# Patient Record
Sex: Female | Born: 1980 | Race: Black or African American | Hispanic: No | Marital: Single | Smoking: Never smoker
Health system: Southern US, Community
[De-identification: ages and names within clinical notes are randomized; demographics above are authoritative.]

---

## 2002-04-10 ENCOUNTER — Encounter: Payer: Self-pay | Admitting: Emergency Medicine

## 2002-04-10 ENCOUNTER — Observation Stay (HOSPITAL_COMMUNITY): Admission: EM | Admit: 2002-04-10 | Discharge: 2002-04-11 | Payer: Self-pay | Admitting: Emergency Medicine

## 2002-06-28 ENCOUNTER — Emergency Department (HOSPITAL_COMMUNITY): Admission: EM | Admit: 2002-06-28 | Discharge: 2002-06-28 | Payer: Self-pay | Admitting: Emergency Medicine

## 2002-07-07 ENCOUNTER — Emergency Department (HOSPITAL_COMMUNITY): Admission: EM | Admit: 2002-07-07 | Discharge: 2002-07-07 | Payer: Self-pay | Admitting: Emergency Medicine

## 2002-07-15 ENCOUNTER — Encounter: Payer: Self-pay | Admitting: Emergency Medicine

## 2002-07-15 ENCOUNTER — Emergency Department (HOSPITAL_COMMUNITY): Admission: EM | Admit: 2002-07-15 | Discharge: 2002-07-15 | Payer: Self-pay | Admitting: Emergency Medicine

## 2002-07-19 ENCOUNTER — Emergency Department (HOSPITAL_COMMUNITY): Admission: EM | Admit: 2002-07-19 | Discharge: 2002-07-19 | Payer: Self-pay | Admitting: Emergency Medicine

## 2002-07-21 ENCOUNTER — Emergency Department (HOSPITAL_COMMUNITY): Admission: EM | Admit: 2002-07-21 | Discharge: 2002-07-22 | Payer: Self-pay | Admitting: Emergency Medicine

## 2002-08-07 ENCOUNTER — Emergency Department (HOSPITAL_COMMUNITY): Admission: EM | Admit: 2002-08-07 | Discharge: 2002-08-08 | Payer: Self-pay | Admitting: Emergency Medicine

## 2002-08-09 ENCOUNTER — Emergency Department (HOSPITAL_COMMUNITY): Admission: EM | Admit: 2002-08-09 | Discharge: 2002-08-10 | Payer: Self-pay | Admitting: Emergency Medicine

## 2002-08-12 ENCOUNTER — Emergency Department (HOSPITAL_COMMUNITY): Admission: EM | Admit: 2002-08-12 | Discharge: 2002-08-12 | Payer: Self-pay | Admitting: Emergency Medicine

## 2002-08-16 ENCOUNTER — Emergency Department (HOSPITAL_COMMUNITY): Admission: EM | Admit: 2002-08-16 | Discharge: 2002-08-16 | Payer: Self-pay | Admitting: Emergency Medicine

## 2002-08-17 ENCOUNTER — Encounter: Payer: Self-pay | Admitting: Emergency Medicine

## 2002-08-18 ENCOUNTER — Inpatient Hospital Stay (HOSPITAL_COMMUNITY): Admission: EM | Admit: 2002-08-18 | Discharge: 2002-08-27 | Payer: Self-pay

## 2002-08-21 ENCOUNTER — Encounter: Payer: Self-pay | Admitting: Internal Medicine

## 2002-08-23 ENCOUNTER — Encounter: Payer: Self-pay | Admitting: Internal Medicine

## 2002-09-19 ENCOUNTER — Other Ambulatory Visit: Admission: RE | Admit: 2002-09-19 | Discharge: 2002-09-19 | Payer: Self-pay | Admitting: Obstetrics and Gynecology

## 2002-11-01 ENCOUNTER — Emergency Department (HOSPITAL_COMMUNITY): Admission: EM | Admit: 2002-11-01 | Discharge: 2002-11-02 | Payer: Self-pay | Admitting: Emergency Medicine

## 2002-11-20 ENCOUNTER — Inpatient Hospital Stay (HOSPITAL_COMMUNITY): Admission: AD | Admit: 2002-11-20 | Discharge: 2002-11-20 | Payer: Self-pay | Admitting: Obstetrics and Gynecology

## 2002-11-20 ENCOUNTER — Encounter: Payer: Self-pay | Admitting: Obstetrics and Gynecology

## 2002-11-22 ENCOUNTER — Inpatient Hospital Stay (HOSPITAL_COMMUNITY): Admission: AD | Admit: 2002-11-22 | Discharge: 2002-11-22 | Payer: Self-pay | Admitting: Obstetrics and Gynecology

## 2002-12-09 ENCOUNTER — Inpatient Hospital Stay (HOSPITAL_COMMUNITY): Admission: AD | Admit: 2002-12-09 | Discharge: 2002-12-09 | Payer: Self-pay | Admitting: Obstetrics and Gynecology

## 2002-12-21 ENCOUNTER — Inpatient Hospital Stay (HOSPITAL_COMMUNITY): Admission: AD | Admit: 2002-12-21 | Discharge: 2002-12-21 | Payer: Self-pay | Admitting: Obstetrics and Gynecology

## 2002-12-28 ENCOUNTER — Encounter: Admission: RE | Admit: 2002-12-28 | Discharge: 2002-12-28 | Payer: Self-pay | Admitting: Dentistry

## 2003-01-26 ENCOUNTER — Inpatient Hospital Stay (HOSPITAL_COMMUNITY): Admission: AD | Admit: 2003-01-26 | Discharge: 2003-01-26 | Payer: Self-pay | Admitting: Obstetrics and Gynecology

## 2003-02-09 ENCOUNTER — Inpatient Hospital Stay (HOSPITAL_COMMUNITY): Admission: AD | Admit: 2003-02-09 | Discharge: 2003-02-12 | Payer: Self-pay | Admitting: Obstetrics and Gynecology

## 2003-02-10 ENCOUNTER — Encounter (INDEPENDENT_AMBULATORY_CARE_PROVIDER_SITE_OTHER): Payer: Self-pay | Admitting: *Deleted

## 2003-03-06 ENCOUNTER — Ambulatory Visit: Admission: RE | Admit: 2003-03-06 | Discharge: 2003-03-06 | Payer: Self-pay | Admitting: Obstetrics and Gynecology

## 2003-03-07 ENCOUNTER — Emergency Department (HOSPITAL_COMMUNITY): Admission: AD | Admit: 2003-03-07 | Discharge: 2003-03-07 | Payer: Self-pay | Admitting: Family Medicine

## 2003-03-18 ENCOUNTER — Emergency Department (HOSPITAL_COMMUNITY): Admission: EM | Admit: 2003-03-18 | Discharge: 2003-03-18 | Payer: Self-pay | Admitting: Emergency Medicine

## 2003-03-20 ENCOUNTER — Other Ambulatory Visit: Admission: RE | Admit: 2003-03-20 | Discharge: 2003-03-20 | Payer: Self-pay | Admitting: Obstetrics and Gynecology

## 2003-07-03 ENCOUNTER — Emergency Department (HOSPITAL_COMMUNITY): Admission: EM | Admit: 2003-07-03 | Discharge: 2003-07-03 | Payer: Self-pay | Admitting: Emergency Medicine

## 2003-07-05 ENCOUNTER — Emergency Department (HOSPITAL_COMMUNITY): Admission: EM | Admit: 2003-07-05 | Discharge: 2003-07-05 | Payer: Self-pay | Admitting: Emergency Medicine

## 2003-07-08 ENCOUNTER — Ambulatory Visit (HOSPITAL_COMMUNITY): Admission: AD | Admit: 2003-07-08 | Discharge: 2003-07-09 | Payer: Self-pay | Admitting: Obstetrics and Gynecology

## 2003-07-09 ENCOUNTER — Encounter (INDEPENDENT_AMBULATORY_CARE_PROVIDER_SITE_OTHER): Payer: Self-pay | Admitting: *Deleted

## 2003-07-29 ENCOUNTER — Emergency Department (HOSPITAL_COMMUNITY): Admission: EM | Admit: 2003-07-29 | Discharge: 2003-07-29 | Payer: Self-pay | Admitting: Emergency Medicine

## 2003-08-20 ENCOUNTER — Emergency Department (HOSPITAL_COMMUNITY): Admission: EM | Admit: 2003-08-20 | Discharge: 2003-08-20 | Payer: Self-pay | Admitting: Emergency Medicine

## 2003-08-21 ENCOUNTER — Emergency Department (HOSPITAL_COMMUNITY): Admission: EM | Admit: 2003-08-21 | Discharge: 2003-08-21 | Payer: Self-pay | Admitting: Emergency Medicine

## 2003-10-22 ENCOUNTER — Inpatient Hospital Stay (HOSPITAL_COMMUNITY): Admission: AD | Admit: 2003-10-22 | Discharge: 2003-10-22 | Payer: Self-pay | Admitting: Family Medicine

## 2003-11-02 ENCOUNTER — Ambulatory Visit (HOSPITAL_COMMUNITY): Admission: RE | Admit: 2003-11-02 | Discharge: 2003-11-02 | Payer: Self-pay | Admitting: Family Medicine

## 2003-11-03 ENCOUNTER — Emergency Department (HOSPITAL_COMMUNITY): Admission: EM | Admit: 2003-11-03 | Discharge: 2003-11-03 | Payer: Self-pay

## 2003-11-04 ENCOUNTER — Emergency Department (HOSPITAL_COMMUNITY): Admission: EM | Admit: 2003-11-04 | Discharge: 2003-11-04 | Payer: Self-pay | Admitting: Emergency Medicine

## 2003-11-07 ENCOUNTER — Emergency Department (HOSPITAL_COMMUNITY): Admission: EM | Admit: 2003-11-07 | Discharge: 2003-11-08 | Payer: Self-pay | Admitting: Emergency Medicine

## 2003-11-08 ENCOUNTER — Inpatient Hospital Stay (HOSPITAL_COMMUNITY): Admission: AD | Admit: 2003-11-08 | Discharge: 2003-11-08 | Payer: Self-pay | Admitting: Obstetrics and Gynecology

## 2003-11-08 ENCOUNTER — Emergency Department (HOSPITAL_COMMUNITY): Admission: EM | Admit: 2003-11-08 | Discharge: 2003-11-08 | Payer: Self-pay | Admitting: Emergency Medicine

## 2003-11-09 ENCOUNTER — Emergency Department (HOSPITAL_COMMUNITY): Admission: EM | Admit: 2003-11-09 | Discharge: 2003-11-09 | Payer: Self-pay | Admitting: Emergency Medicine

## 2003-11-18 ENCOUNTER — Emergency Department (HOSPITAL_COMMUNITY): Admission: EM | Admit: 2003-11-18 | Discharge: 2003-11-19 | Payer: Self-pay | Admitting: Emergency Medicine

## 2004-01-27 ENCOUNTER — Emergency Department (HOSPITAL_COMMUNITY): Admission: EM | Admit: 2004-01-27 | Discharge: 2004-01-27 | Payer: Self-pay

## 2004-02-06 ENCOUNTER — Emergency Department (HOSPITAL_COMMUNITY): Admission: EM | Admit: 2004-02-06 | Discharge: 2004-02-06 | Payer: Self-pay | Admitting: Emergency Medicine

## 2004-03-02 ENCOUNTER — Inpatient Hospital Stay (HOSPITAL_COMMUNITY): Admission: AD | Admit: 2004-03-02 | Discharge: 2004-03-02 | Payer: Self-pay | Admitting: *Deleted

## 2004-03-04 ENCOUNTER — Inpatient Hospital Stay (HOSPITAL_COMMUNITY): Admission: AD | Admit: 2004-03-04 | Discharge: 2004-03-04 | Payer: Self-pay | Admitting: Obstetrics and Gynecology

## 2004-03-06 ENCOUNTER — Inpatient Hospital Stay (HOSPITAL_COMMUNITY): Admission: AD | Admit: 2004-03-06 | Discharge: 2004-03-06 | Payer: Self-pay | Admitting: Family Medicine

## 2004-03-13 ENCOUNTER — Inpatient Hospital Stay (HOSPITAL_COMMUNITY): Admission: AD | Admit: 2004-03-13 | Discharge: 2004-03-13 | Payer: Self-pay | Admitting: Obstetrics and Gynecology

## 2004-04-04 ENCOUNTER — Emergency Department (HOSPITAL_COMMUNITY): Admission: EM | Admit: 2004-04-04 | Discharge: 2004-04-04 | Payer: Self-pay | Admitting: Emergency Medicine

## 2004-04-13 ENCOUNTER — Emergency Department (HOSPITAL_COMMUNITY): Admission: EM | Admit: 2004-04-13 | Discharge: 2004-04-14 | Payer: Self-pay | Admitting: Emergency Medicine

## 2004-06-14 ENCOUNTER — Emergency Department (HOSPITAL_COMMUNITY): Admission: EM | Admit: 2004-06-14 | Discharge: 2004-06-14 | Payer: Self-pay | Admitting: Emergency Medicine

## 2004-06-26 ENCOUNTER — Emergency Department (HOSPITAL_COMMUNITY): Admission: EM | Admit: 2004-06-26 | Discharge: 2004-06-26 | Payer: Self-pay | Admitting: Emergency Medicine

## 2004-08-12 ENCOUNTER — Emergency Department (HOSPITAL_COMMUNITY): Admission: EM | Admit: 2004-08-12 | Discharge: 2004-08-12 | Payer: Self-pay | Admitting: *Deleted

## 2004-09-16 ENCOUNTER — Inpatient Hospital Stay (HOSPITAL_COMMUNITY): Admission: AD | Admit: 2004-09-16 | Discharge: 2004-09-16 | Payer: Self-pay | Admitting: Obstetrics and Gynecology

## 2004-10-07 ENCOUNTER — Emergency Department (HOSPITAL_COMMUNITY): Admission: EM | Admit: 2004-10-07 | Discharge: 2004-10-07 | Payer: Self-pay | Admitting: Emergency Medicine

## 2004-12-26 ENCOUNTER — Emergency Department (HOSPITAL_COMMUNITY): Admission: EM | Admit: 2004-12-26 | Discharge: 2004-12-26 | Payer: Self-pay | Admitting: Emergency Medicine

## 2005-02-11 ENCOUNTER — Emergency Department (HOSPITAL_COMMUNITY): Admission: EM | Admit: 2005-02-11 | Discharge: 2005-02-11 | Payer: Self-pay | Admitting: Emergency Medicine

## 2005-10-04 ENCOUNTER — Emergency Department (HOSPITAL_COMMUNITY): Admission: AD | Admit: 2005-10-04 | Discharge: 2005-10-04 | Payer: Self-pay | Admitting: Family Medicine

## 2005-10-06 ENCOUNTER — Emergency Department (HOSPITAL_COMMUNITY): Admission: EM | Admit: 2005-10-06 | Discharge: 2005-10-06 | Payer: Self-pay | Admitting: Family Medicine

## 2006-07-16 ENCOUNTER — Emergency Department (HOSPITAL_COMMUNITY): Admission: EM | Admit: 2006-07-16 | Discharge: 2006-07-16 | Payer: Self-pay | Admitting: Emergency Medicine

## 2007-01-05 ENCOUNTER — Inpatient Hospital Stay (HOSPITAL_COMMUNITY): Admission: AD | Admit: 2007-01-05 | Discharge: 2007-01-05 | Payer: Self-pay | Admitting: Family Medicine

## 2007-01-29 ENCOUNTER — Inpatient Hospital Stay (HOSPITAL_COMMUNITY): Admission: AD | Admit: 2007-01-29 | Discharge: 2007-01-29 | Payer: Self-pay | Admitting: Obstetrics and Gynecology

## 2007-03-04 ENCOUNTER — Inpatient Hospital Stay (HOSPITAL_COMMUNITY): Admission: AD | Admit: 2007-03-04 | Discharge: 2007-03-04 | Payer: Self-pay | Admitting: Obstetrics and Gynecology

## 2007-03-09 ENCOUNTER — Inpatient Hospital Stay (HOSPITAL_COMMUNITY): Admission: AD | Admit: 2007-03-09 | Discharge: 2007-03-09 | Payer: Self-pay | Admitting: Obstetrics and Gynecology

## 2007-03-12 ENCOUNTER — Inpatient Hospital Stay (HOSPITAL_COMMUNITY): Admission: AD | Admit: 2007-03-12 | Discharge: 2007-03-12 | Payer: Self-pay | Admitting: Obstetrics and Gynecology

## 2007-03-18 ENCOUNTER — Inpatient Hospital Stay (HOSPITAL_COMMUNITY): Admission: AD | Admit: 2007-03-18 | Discharge: 2007-03-18 | Payer: Self-pay | Admitting: Obstetrics and Gynecology

## 2007-04-05 ENCOUNTER — Inpatient Hospital Stay (HOSPITAL_COMMUNITY): Admission: AD | Admit: 2007-04-05 | Discharge: 2007-04-05 | Payer: Self-pay | Admitting: Obstetrics and Gynecology

## 2007-04-15 ENCOUNTER — Inpatient Hospital Stay (HOSPITAL_COMMUNITY): Admission: AD | Admit: 2007-04-15 | Discharge: 2007-04-16 | Payer: Self-pay | Admitting: Obstetrics and Gynecology

## 2007-04-23 ENCOUNTER — Inpatient Hospital Stay (HOSPITAL_COMMUNITY): Admission: AD | Admit: 2007-04-23 | Discharge: 2007-04-23 | Payer: Self-pay | Admitting: Obstetrics and Gynecology

## 2007-05-02 ENCOUNTER — Inpatient Hospital Stay (HOSPITAL_COMMUNITY): Admission: AD | Admit: 2007-05-02 | Discharge: 2007-05-02 | Payer: Self-pay | Admitting: Obstetrics & Gynecology

## 2007-05-03 ENCOUNTER — Inpatient Hospital Stay (HOSPITAL_COMMUNITY): Admission: AD | Admit: 2007-05-03 | Discharge: 2007-05-03 | Payer: Self-pay | Admitting: Obstetrics and Gynecology

## 2007-06-23 ENCOUNTER — Inpatient Hospital Stay (HOSPITAL_COMMUNITY): Admission: AD | Admit: 2007-06-23 | Discharge: 2007-06-23 | Payer: Self-pay | Admitting: Obstetrics & Gynecology

## 2007-07-02 ENCOUNTER — Inpatient Hospital Stay (HOSPITAL_COMMUNITY): Admission: AD | Admit: 2007-07-02 | Discharge: 2007-07-02 | Payer: Self-pay | Admitting: Obstetrics & Gynecology

## 2007-08-03 ENCOUNTER — Inpatient Hospital Stay (HOSPITAL_COMMUNITY): Admission: AD | Admit: 2007-08-03 | Discharge: 2007-08-03 | Payer: Self-pay | Admitting: Obstetrics and Gynecology

## 2007-08-11 ENCOUNTER — Inpatient Hospital Stay (HOSPITAL_COMMUNITY): Admission: AD | Admit: 2007-08-11 | Discharge: 2007-08-11 | Payer: Self-pay | Admitting: Obstetrics and Gynecology

## 2007-08-17 ENCOUNTER — Ambulatory Visit (HOSPITAL_COMMUNITY): Admission: RE | Admit: 2007-08-17 | Discharge: 2007-08-17 | Payer: Self-pay | Admitting: Obstetrics & Gynecology

## 2007-08-26 ENCOUNTER — Inpatient Hospital Stay (HOSPITAL_COMMUNITY): Admission: AD | Admit: 2007-08-26 | Discharge: 2007-08-26 | Payer: Self-pay | Admitting: Obstetrics and Gynecology

## 2007-08-29 ENCOUNTER — Inpatient Hospital Stay (HOSPITAL_COMMUNITY): Admission: AD | Admit: 2007-08-29 | Discharge: 2007-09-01 | Payer: Self-pay | Admitting: Obstetrics and Gynecology

## 2008-02-03 ENCOUNTER — Emergency Department (HOSPITAL_COMMUNITY): Admission: EM | Admit: 2008-02-03 | Discharge: 2008-02-03 | Payer: Self-pay | Admitting: Family Medicine

## 2008-04-19 ENCOUNTER — Emergency Department (HOSPITAL_COMMUNITY): Admission: EM | Admit: 2008-04-19 | Discharge: 2008-04-19 | Payer: Self-pay | Admitting: Emergency Medicine

## 2008-05-02 ENCOUNTER — Emergency Department (HOSPITAL_COMMUNITY): Admission: EM | Admit: 2008-05-02 | Discharge: 2008-05-02 | Payer: Self-pay | Admitting: Family Medicine

## 2008-05-15 ENCOUNTER — Emergency Department (HOSPITAL_COMMUNITY): Admission: EM | Admit: 2008-05-15 | Discharge: 2008-05-15 | Payer: Self-pay | Admitting: Emergency Medicine

## 2009-04-03 ENCOUNTER — Emergency Department (HOSPITAL_COMMUNITY): Admission: EM | Admit: 2009-04-03 | Discharge: 2009-04-03 | Payer: Self-pay | Admitting: Emergency Medicine

## 2009-07-19 ENCOUNTER — Emergency Department (HOSPITAL_COMMUNITY): Admission: EM | Admit: 2009-07-19 | Discharge: 2009-07-19 | Payer: Self-pay | Admitting: Emergency Medicine

## 2009-08-06 ENCOUNTER — Ambulatory Visit: Payer: Self-pay | Admitting: Advanced Practice Midwife

## 2009-08-06 ENCOUNTER — Inpatient Hospital Stay (HOSPITAL_COMMUNITY): Admission: AD | Admit: 2009-08-06 | Discharge: 2009-08-06 | Payer: Self-pay | Admitting: Obstetrics and Gynecology

## 2009-08-21 ENCOUNTER — Inpatient Hospital Stay (HOSPITAL_COMMUNITY): Admission: AD | Admit: 2009-08-21 | Discharge: 2009-08-21 | Payer: Self-pay | Admitting: Obstetrics & Gynecology

## 2009-09-08 ENCOUNTER — Inpatient Hospital Stay (HOSPITAL_COMMUNITY): Admission: AD | Admit: 2009-09-08 | Discharge: 2009-09-08 | Payer: Self-pay | Admitting: Obstetrics and Gynecology

## 2009-09-13 IMAGING — US US FETAL BPP W/O NONSTRESS
1 series · 14 of 20 positions shown · non-contrast
Comparison: none

OBSTETRICAL ULTRASOUND:
 This ultrasound exam was performed in the [HOSPITAL] Ultrasound Department.  The OB US report was generated in the AS system, and faxed to the ordering physician.  This report is also available in [REDACTED] PACS.

[Series 1: us fetal bpp w/o nonstress · 0.28mm/px · 14 of 20 slices shown]
[im 1/20]
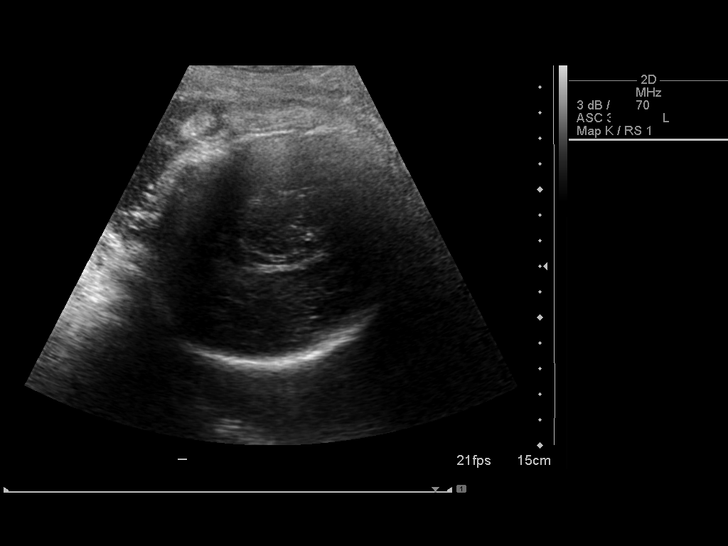
[im 3/20]
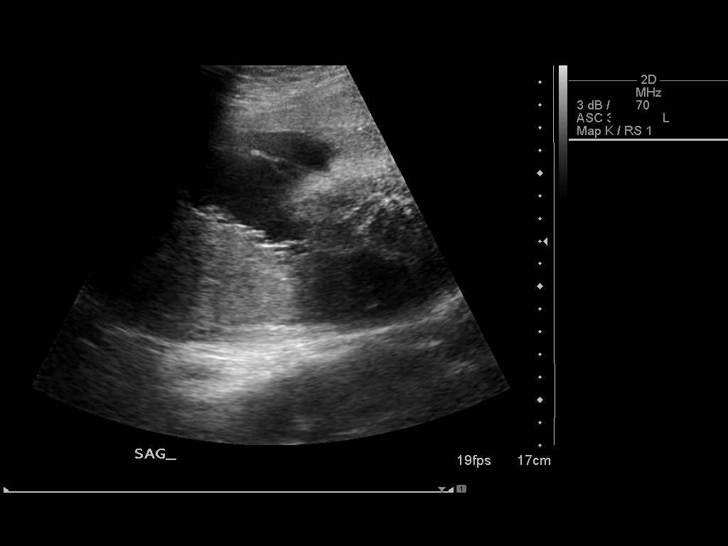
[im 4/20]
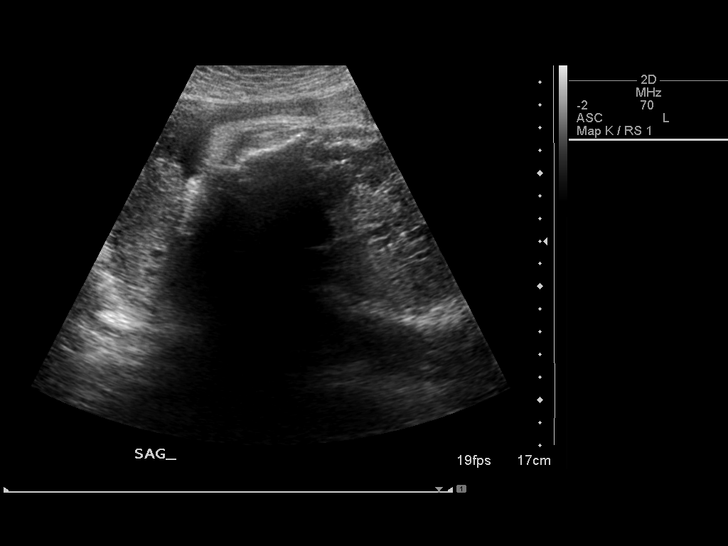
[im 6/20]
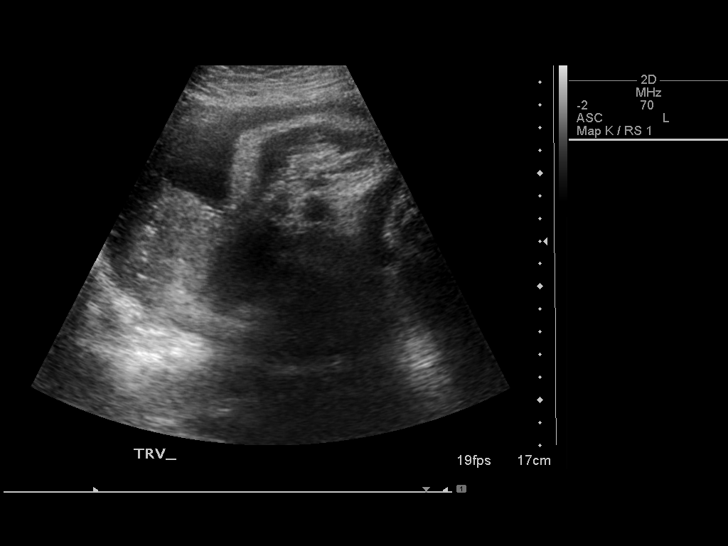
[im 7/20]
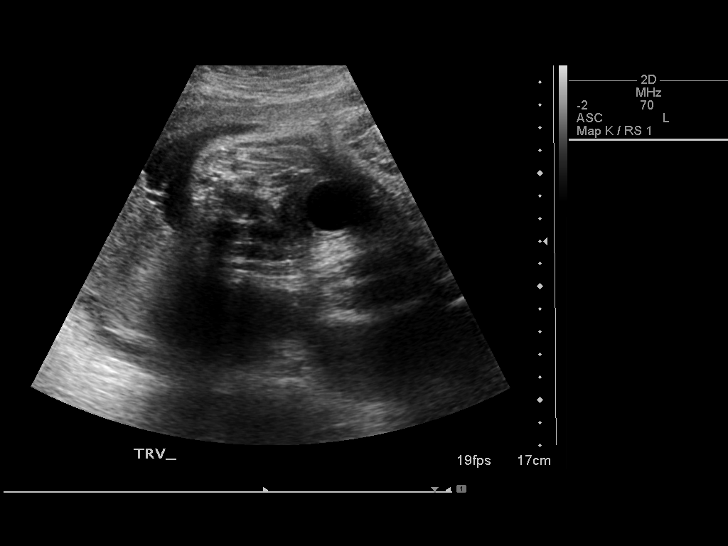
[im 8/20]
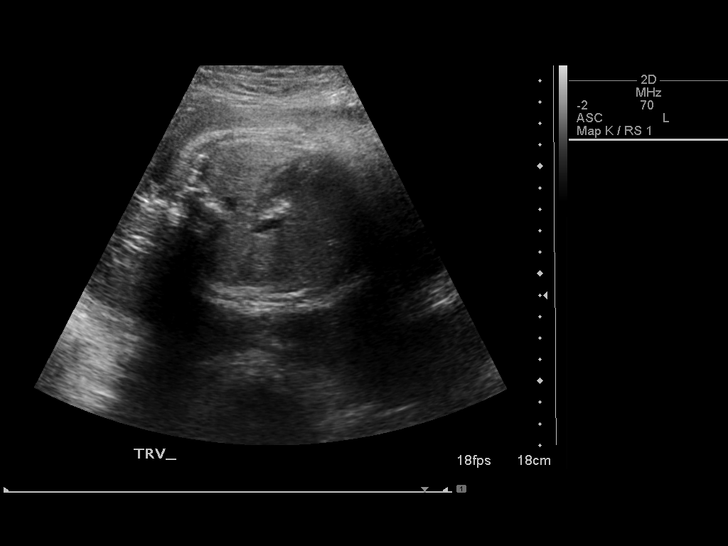
[im 10/20]
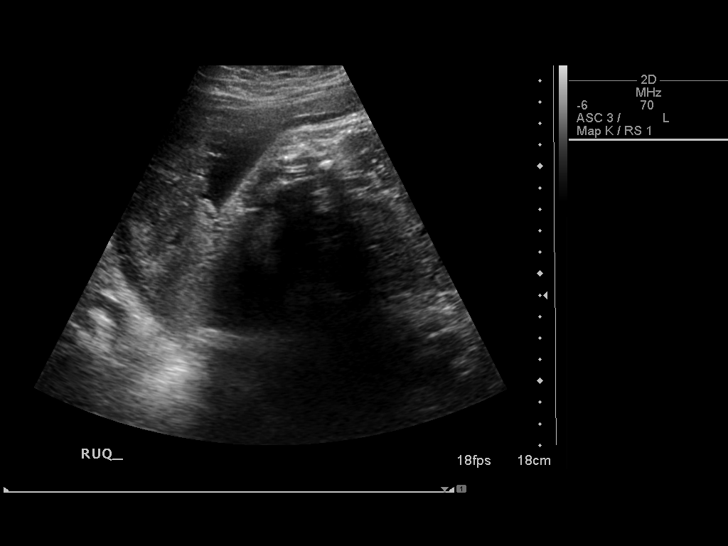
[im 11/20]
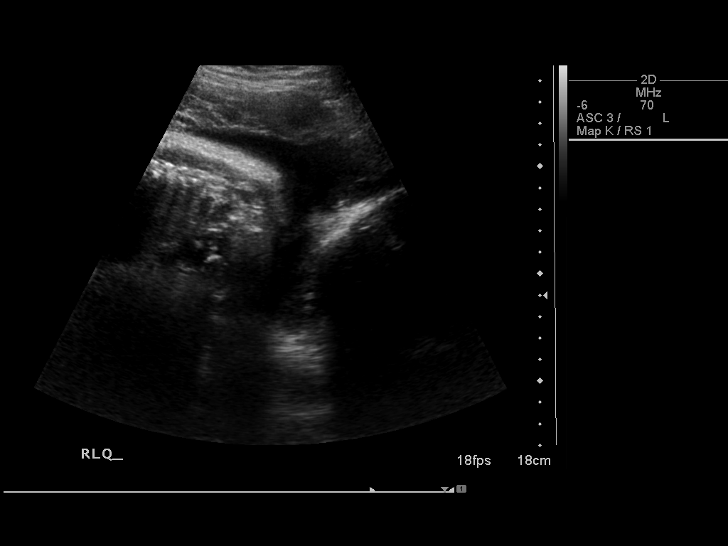
[im 13/20]
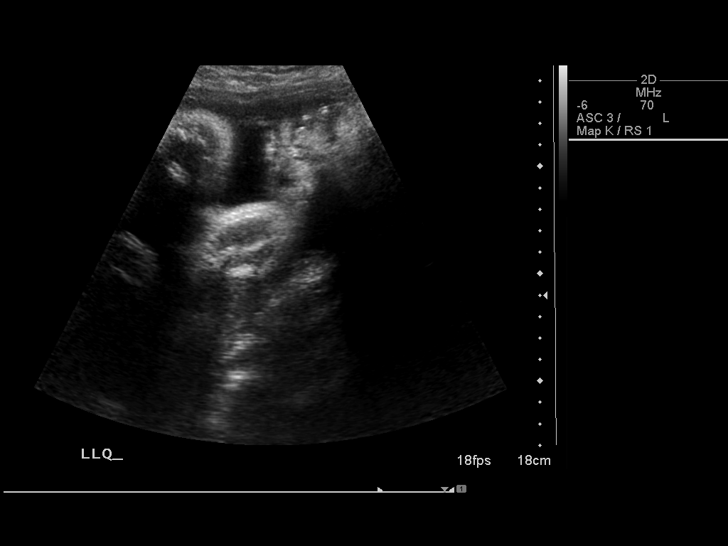
[im 14/20]
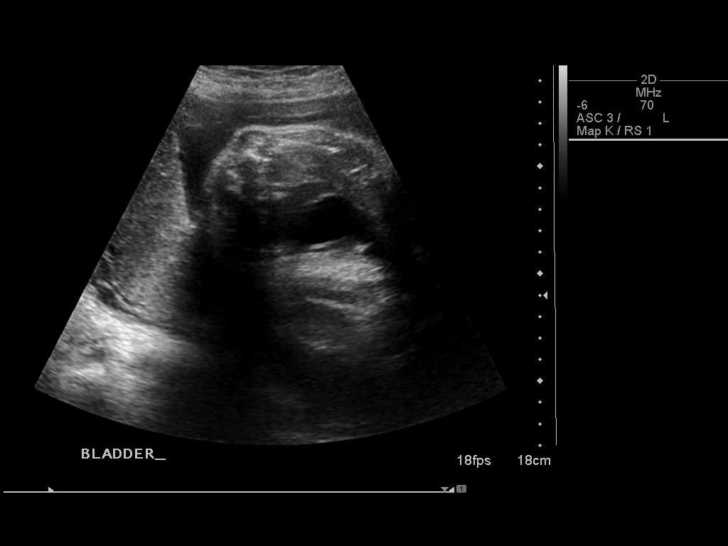
[im 16/20]
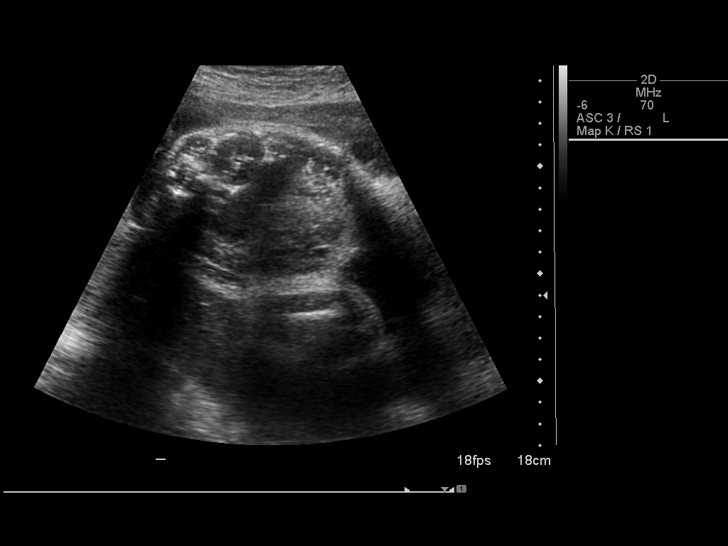
[im 17/20]
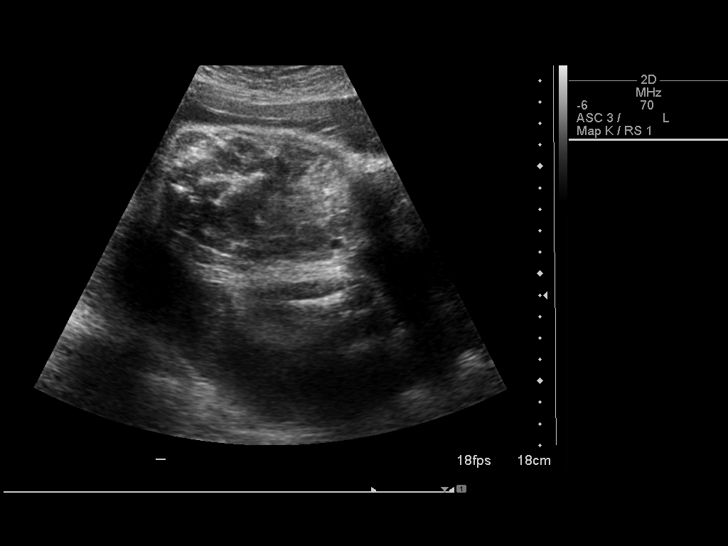
[im 18/20]
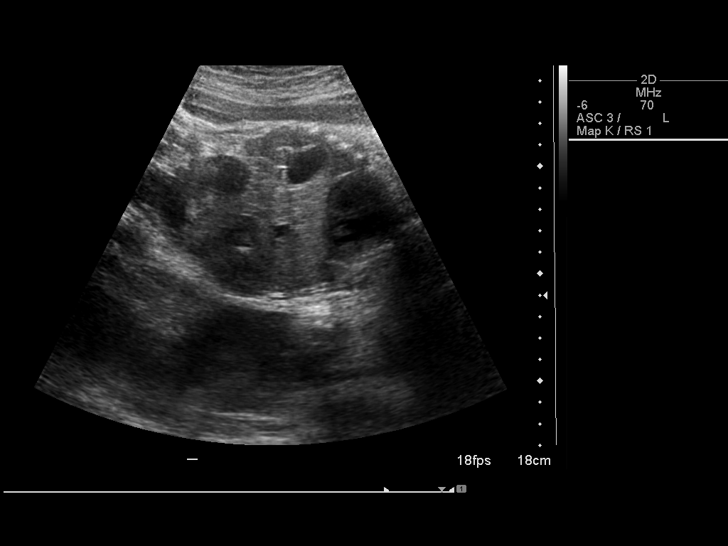
[im 20/20]
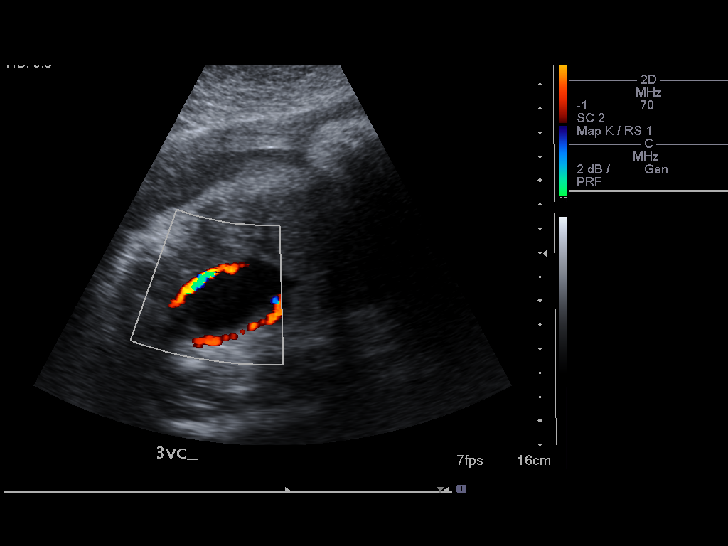

[14 of 20 positions shown; findings below may reference images not displayed]

IMPRESSION: See AS Obstetric US report.

## 2009-09-17 ENCOUNTER — Inpatient Hospital Stay (HOSPITAL_COMMUNITY): Admission: AD | Admit: 2009-09-17 | Discharge: 2009-09-17 | Payer: Self-pay | Admitting: Obstetrics & Gynecology

## 2009-09-25 IMAGING — US US OB LIMITED
1 series · 12 of 12 positions shown · non-contrast
Comparison: none

OBSTETRICAL ULTRASOUND:
 This ultrasound exam was performed in the [HOSPITAL] Ultrasound Department.  The OB US report was generated in the AS system, and faxed to the ordering physician.  This report is also available in [REDACTED] PACS.

[Series 1: us fetal bpp w/o nonstress · non-contrast · 12 of 12 slices shown]
[im 1/12]
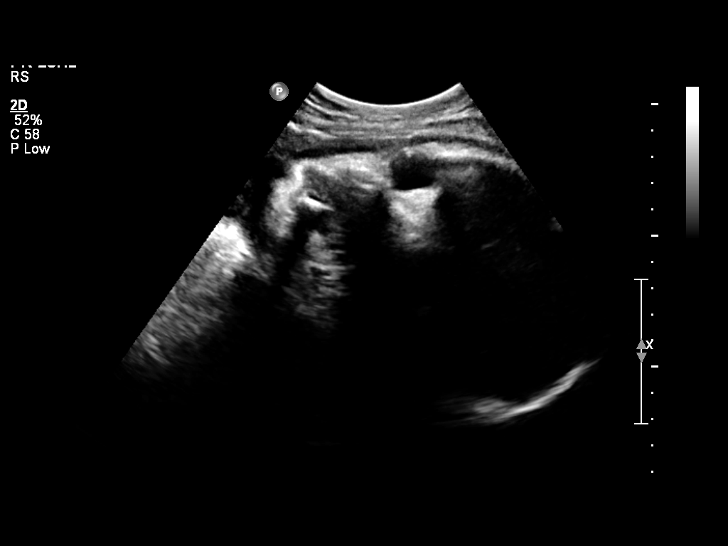
[im 2/12]
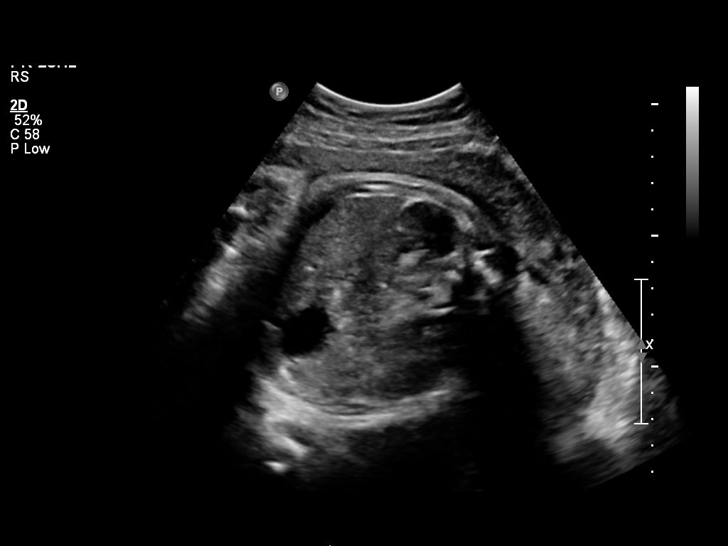
[im 3/12]
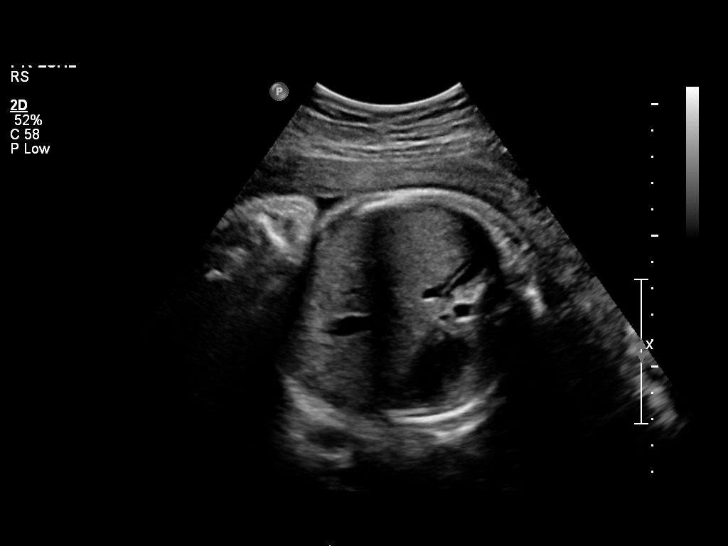
[im 4/12]
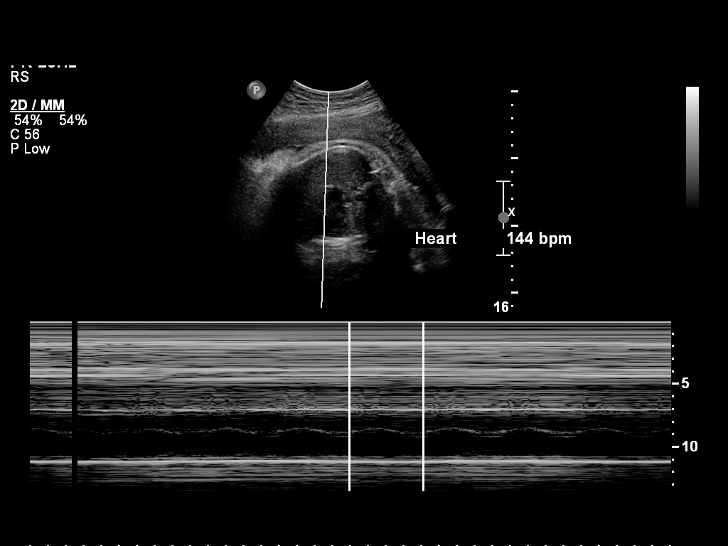
[im 5/12]
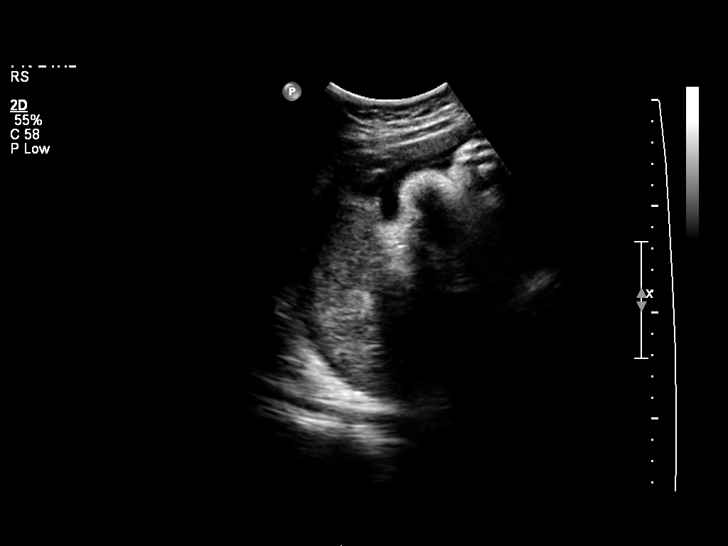
[im 6/12]
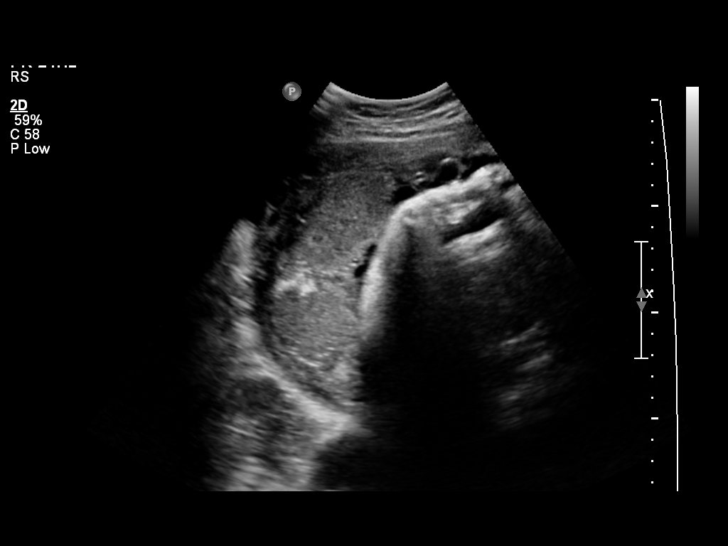
[im 7/12]
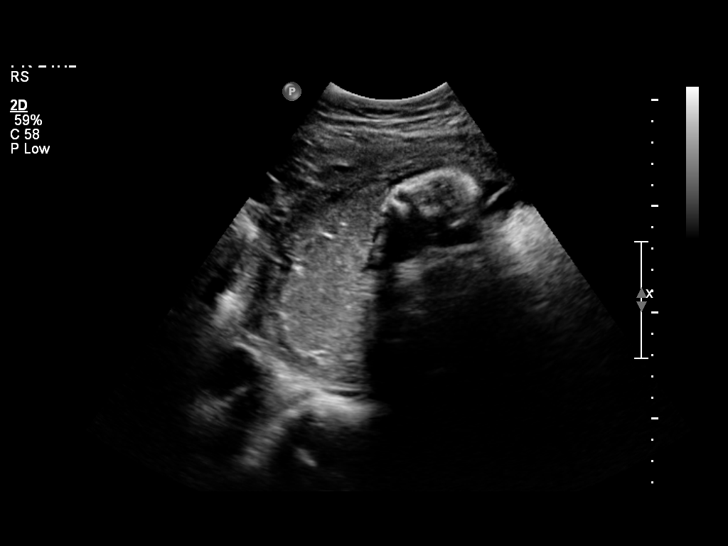
[im 8/12]
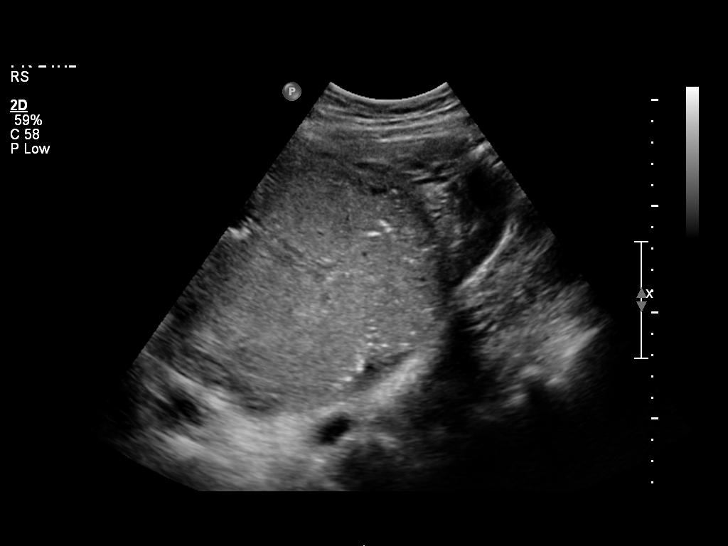
[im 9/12]
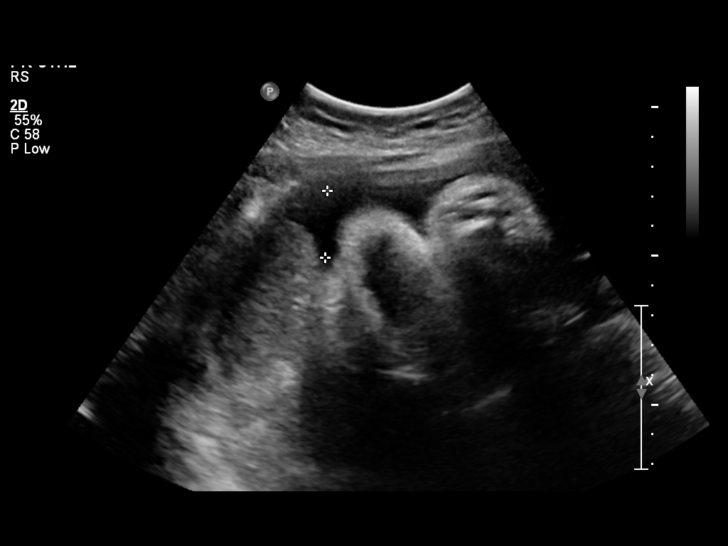
[im 10/12]
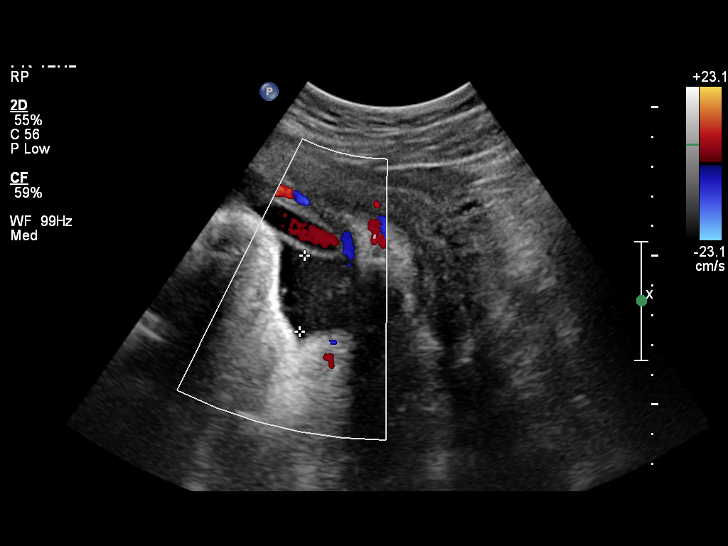
[im 11/12]
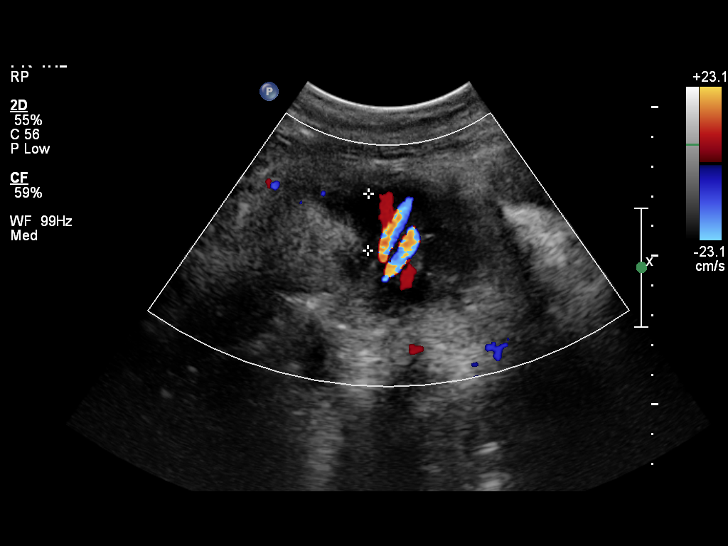
[im 12/12]
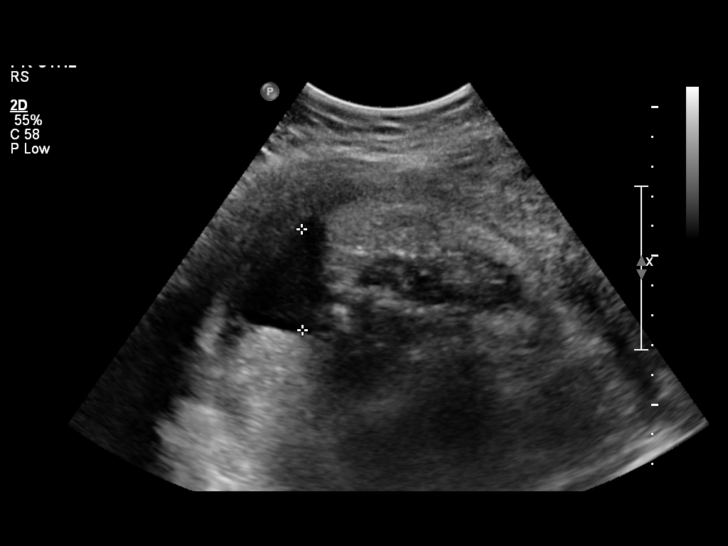

[12 of 12 positions shown; findings below may reference images not displayed]

IMPRESSION: See AS Obstetric US report.

## 2009-09-28 ENCOUNTER — Ambulatory Visit: Payer: Self-pay | Admitting: Obstetrics and Gynecology

## 2009-09-28 ENCOUNTER — Inpatient Hospital Stay (HOSPITAL_COMMUNITY): Admission: AD | Admit: 2009-09-28 | Discharge: 2009-09-29 | Payer: Self-pay | Admitting: Obstetrics & Gynecology

## 2009-11-27 ENCOUNTER — Inpatient Hospital Stay (HOSPITAL_COMMUNITY): Admission: AD | Admit: 2009-11-27 | Discharge: 2009-11-27 | Payer: Self-pay | Admitting: Obstetrics and Gynecology

## 2009-11-27 ENCOUNTER — Ambulatory Visit: Payer: Self-pay | Admitting: Obstetrics and Gynecology

## 2009-12-06 ENCOUNTER — Inpatient Hospital Stay (HOSPITAL_COMMUNITY): Admission: AD | Admit: 2009-12-06 | Discharge: 2009-12-06 | Payer: Self-pay | Admitting: Obstetrics & Gynecology

## 2010-01-05 ENCOUNTER — Inpatient Hospital Stay (HOSPITAL_COMMUNITY): Admission: AD | Admit: 2010-01-05 | Discharge: 2010-01-05 | Payer: Self-pay | Admitting: Obstetrics & Gynecology

## 2010-02-04 ENCOUNTER — Ambulatory Visit: Payer: Self-pay | Admitting: Nurse Practitioner

## 2010-02-04 ENCOUNTER — Inpatient Hospital Stay (HOSPITAL_COMMUNITY): Admission: AD | Admit: 2010-02-04 | Discharge: 2010-02-04 | Payer: Self-pay | Admitting: Obstetrics and Gynecology

## 2010-02-28 ENCOUNTER — Inpatient Hospital Stay (HOSPITAL_COMMUNITY)
Admission: AD | Admit: 2010-02-28 | Discharge: 2010-02-28 | Payer: Self-pay | Source: Home / Self Care | Admitting: Obstetrics and Gynecology

## 2010-03-04 ENCOUNTER — Inpatient Hospital Stay (HOSPITAL_COMMUNITY)
Admission: RE | Admit: 2010-03-04 | Discharge: 2010-03-04 | Payer: Self-pay | Source: Home / Self Care | Admitting: Internal Medicine

## 2010-03-22 ENCOUNTER — Inpatient Hospital Stay (HOSPITAL_COMMUNITY)
Admission: RE | Admit: 2010-03-22 | Discharge: 2010-03-25 | Payer: Self-pay | Source: Home / Self Care | Admitting: Obstetrics and Gynecology

## 2010-07-09 LAB — CBC
HCT: 29.4 % — ABNORMAL LOW (ref 36.0–46.0)
Hemoglobin: 9.9 g/dL — ABNORMAL LOW (ref 12.0–15.0)
MCHC: 32.3 g/dL (ref 30.0–36.0)
Platelets: 287 10*3/uL (ref 150–400)
Platelets: 290 10*3/uL (ref 150–400)
RBC: 3.73 MIL/uL — ABNORMAL LOW (ref 3.87–5.11)
RDW: 15.5 % (ref 11.5–15.5)
RDW: 16.4 % — ABNORMAL HIGH (ref 11.5–15.5)
WBC: 11.7 10*3/uL — ABNORMAL HIGH (ref 4.0–10.5)
WBC: 7.6 10*3/uL (ref 4.0–10.5)

## 2010-07-09 LAB — RPR: RPR Ser Ql: NONREACTIVE

## 2010-07-09 LAB — SURGICAL PCR SCREEN: MRSA, PCR: NEGATIVE

## 2010-07-09 LAB — TYPE AND SCREEN: ABO/RH(D): B POS

## 2010-07-10 LAB — URINE CULTURE
Colony Count: NO GROWTH
Culture  Setup Time: 201110101821
Culture: NO GROWTH

## 2010-07-10 LAB — URINE MICROSCOPIC-ADD ON

## 2010-07-10 LAB — URINALYSIS, ROUTINE W REFLEX MICROSCOPIC
Glucose, UA: 100 mg/dL — AB
Ketones, ur: 15 mg/dL — AB
Nitrite: NEGATIVE
Protein, ur: NEGATIVE mg/dL
Specific Gravity, Urine: 1.025 (ref 1.005–1.030)
Urobilinogen, UA: 4 mg/dL — ABNORMAL HIGH (ref 0.0–1.0)

## 2010-07-11 LAB — URINE MICROSCOPIC-ADD ON

## 2010-07-11 LAB — COMPREHENSIVE METABOLIC PANEL
CO2: 24 mEq/L (ref 19–32)
Calcium: 8.9 mg/dL (ref 8.4–10.5)
Creatinine, Ser: 0.61 mg/dL (ref 0.4–1.2)
GFR calc Af Amer: >60 mL/min (ref >60)
GFR calc non Af Amer: >60 mL/min (ref >60)
Glucose, Bld: 71 mg/dL (ref 70–99)

## 2010-07-11 LAB — URINALYSIS, ROUTINE W REFLEX MICROSCOPIC
Hgb urine dipstick: NEGATIVE
Nitrite: NEGATIVE
Specific Gravity, Urine: >1.03 — ABNORMAL HIGH (ref 1.005–1.030)
pH: 6 (ref 5.0–8.0)

## 2010-07-11 LAB — CBC
Hemoglobin: 11.4 g/dL — ABNORMAL LOW (ref 12.0–15.0)
Platelets: 259 10*3/uL (ref 150–400)
RBC: 3.97 MIL/uL (ref 3.87–5.11)
WBC: 9.3 10*3/uL (ref 4.0–10.5)

## 2010-07-11 LAB — URINE CULTURE

## 2010-07-12 LAB — URINE MICROSCOPIC-ADD ON

## 2010-07-12 LAB — URINALYSIS, ROUTINE W REFLEX MICROSCOPIC
Glucose, UA: NEGATIVE mg/dL
Hgb urine dipstick: NEGATIVE
Nitrite: NEGATIVE
Specific Gravity, Urine: 1.025 (ref 1.005–1.030)
Specific Gravity, Urine: >1.03 — ABNORMAL HIGH (ref 1.005–1.030)
pH: 6 (ref 5.0–8.0)
pH: 6.5 (ref 5.0–8.0)

## 2010-07-12 LAB — WET PREP, GENITAL
Trich, Wet Prep: NONE SEEN
Yeast Wet Prep HPF POC: NONE SEEN

## 2010-07-12 LAB — URINE CULTURE
Colony Count: 5000
Culture  Setup Time: 201108120040

## 2010-07-15 LAB — BASIC METABOLIC PANEL
BUN: 4 mg/dL — ABNORMAL LOW (ref 6–23)
CO2: 26 mEq/L (ref 19–32)
Calcium: 8.4 mg/dL (ref 8.4–10.5)
Chloride: 103 mEq/L (ref 96–112)
Creatinine, Ser: 0.61 mg/dL (ref 0.4–1.2)
GFR calc Af Amer: >60 mL/min (ref >60)

## 2010-07-15 LAB — COMPREHENSIVE METABOLIC PANEL
Albumin: 3.2 g/dL — ABNORMAL LOW (ref 3.5–5.2)
BUN: 4 mg/dL — ABNORMAL LOW (ref 6–23)
Calcium: 8.7 mg/dL (ref 8.4–10.5)
Chloride: 105 mEq/L (ref 96–112)
Creatinine, Ser: 0.61 mg/dL (ref 0.4–1.2)
GFR calc Af Amer: >60 mL/min (ref >60)
Total Bilirubin: 0.8 mg/dL (ref 0.3–1.2)
Total Protein: 6.1 g/dL (ref 6.0–8.3)

## 2010-07-15 LAB — URINALYSIS, ROUTINE W REFLEX MICROSCOPIC
Glucose, UA: NEGATIVE mg/dL
Glucose, UA: NEGATIVE mg/dL
Ketones, ur: >80 mg/dL — AB
Ketones, ur: >80 mg/dL — AB
Protein, ur: NEGATIVE mg/dL
Protein, ur: NEGATIVE mg/dL
Urobilinogen, UA: >8 mg/dL — ABNORMAL HIGH (ref 0.0–1.0)
pH: 7.5 (ref 5.0–8.0)

## 2010-07-15 LAB — CBC
HCT: 35.7 % — ABNORMAL LOW (ref 36.0–46.0)
MCHC: 33.9 g/dL (ref 30.0–36.0)
MCV: 87.6 fL (ref 78.0–100.0)
Platelets: 221 10*3/uL (ref 150–400)
RDW: 14.8 % (ref 11.5–15.5)

## 2010-07-15 LAB — URINE MICROSCOPIC-ADD ON

## 2010-07-15 LAB — URINE CULTURE: Culture: NO GROWTH

## 2010-07-16 LAB — CBC
Hemoglobin: 12.9 g/dL (ref 12.0–15.0)
MCV: 87.4 fL (ref 78.0–100.0)
RBC: 4.51 MIL/uL (ref 3.87–5.11)
WBC: 9.4 10*3/uL (ref 4.0–10.5)

## 2010-07-16 LAB — COMPREHENSIVE METABOLIC PANEL
ALT: 15 U/L (ref 0–35)
AST: 17 U/L (ref 0–37)
CO2: 25 mEq/L (ref 19–32)
Chloride: 104 mEq/L (ref 96–112)
Creatinine, Ser: 0.54 mg/dL (ref 0.4–1.2)
GFR calc Af Amer: >60 mL/min (ref >60)
GFR calc non Af Amer: >60 mL/min (ref >60)
Glucose, Bld: 87 mg/dL (ref 70–99)
Sodium: 135 mEq/L (ref 135–145)
Total Bilirubin: 0.7 mg/dL (ref 0.3–1.2)

## 2010-07-16 LAB — URINALYSIS, ROUTINE W REFLEX MICROSCOPIC
Hgb urine dipstick: NEGATIVE
Ketones, ur: 15 mg/dL — AB
Nitrite: NEGATIVE
Protein, ur: 30 mg/dL — AB
Specific Gravity, Urine: >1.03 — ABNORMAL HIGH (ref 1.005–1.030)
Urobilinogen, UA: 0.2 mg/dL (ref 0.0–1.0)
Urobilinogen, UA: 1 mg/dL (ref 0.0–1.0)
pH: 6 (ref 5.0–8.0)

## 2010-07-17 LAB — POCT PREGNANCY, URINE: Preg Test, Ur: POSITIVE

## 2010-07-30 LAB — KOH PREP: KOH Prep: NONE SEEN

## 2010-07-30 LAB — CULTURE, FUNGUS WITHOUT SMEAR

## 2010-09-10 NOTE — Op Note (Signed)
NAME:  RAPHAELLA, LARKIN           ACCOUNT NO.:  [id number]   MEDICAL RECORD NO.:  [id number]          PATIENT TYPE:  INP   LOCATION:  9119                          FACILITY:  [facility]   PHYSICIAN:  Malva Limes, M.D.    DATE OF BIRTH:  1980-07-22   DATE OF PROCEDURE:  08/29/2007  DATE OF DISCHARGE:                               OPERATIVE REPORT   PREOPERATIVE DIAGNOSES:  1. Intrauterine pregnancy at term.  2. Active labor.  3. Suspicious labial lesion for herpes simplex virus.   PROCEDURE:  Primary low-transverse cesarean section.   SURGEON:  Malva Limes, MD   ANTIBIOTICS:  Ancef 1 gram.   ANESTHESIA:  Spinal.   ESTIMATED BLOOD LOSS:  900 mL.   COMPLICATIONS:  None.   SPECIMENS:  None.   DRAINS:  Foley bedside drainage.   PROCEDURE IN DETAIL:  The patient was taken to the operating room where  spinal anesthetic was administered without complications.  She was then  prepped and draped in the usual fashion for this procedure.  A  Pfannenstiel incision was made.  This was carried down through the  fascia.  Fascia was entered midline and extended laterally with Mayo  scissors.  The rectus muscles were then dissected from the fascia with a  Bovie.  Rectus muscles were separated in the midline and taken  superiorly and inferiorly.  The parietal peritoneum was entered sharply.  The bladder flap was taken down sharply.  A low-transverse uterine  incision was made in the midline and extended laterally with blunt  dissection.  Amniotic fluid was noted be clear.  The infant was  delivered in the vertex presentation.  On delivery of the head, the  oropharynx and nostrils were bulb suctioned.  There was a nuchal cord  x1.  The infant was handed to the awaiting NICU team.  Placenta was then  manually removed.  The uterus was exteriorized.  The uterine cavity was  cleaned with a wet lap and inspected.  The uterine incision was closed  in a single layer of 0 Monocryl in a running  locking fashion.  The  bladder flap was closed using 2-0 Monocryl in a running fashion.  The  uterus was placed back in the abdominal cavity.  Hemostasis was again  checked and felt to be adequate.  Ovaries and fallopian tubes were  normal bilaterally.  The rectus muscles and parietal peritoneum were  closed using 2-0 Monocryl in a running fashion.  The fascia was closed  using 0 Monocryl suture in a running  fashion.  Subcuticular tissue was made hemostatic with a Bovie.  Stainless steel clips were used to close the skin.  The patient  tolerated the procedure well.  She was taken to recovery room in stable  condition.  Instrument and needle were counts correct x2.           ______________________________  Malva Limes, M.D.     MA/MEDQ  D:  08/29/2007  T:  08/29/2007  Job:  [id number]

## 2010-09-13 NOTE — Discharge Summary (Signed)
NAME:  Rebecca Levine, Rebecca Levine                     ACCOUNT NO.:  [id number]   MEDICAL RECORD NO.:  [id number]                   PATIENT TYPE:  INP   LOCATION:  5001                                 FACILITY:  [facility]   PHYSICIAN:  Lilyan Punt. Sydnee Levans, M.D.             DATE OF BIRTH:  [birth year]   DATE OF ADMISSION:  08/17/2002  DATE OF DISCHARGE:                                 DISCHARGE SUMMARY   DISCHARGE DIAGNOSES:  1. Intrauterine pregnancy first trimester with estimated date of confinement     of 11/04 per last menstrual period consistent with first trimester     ultrasound 08/21/02 placing intrauterine pregnancy at approximately 13 and     07 weeks.  2. Hyperemesis gravidarum versus nausea and vomiting in pregnancy.  3. 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 for eight     days.  4. Bacterial vaginosis with therapy at discharge with MetroGel one     applicator full for five nights.  5. Polymicrobial urinary tract infection.  6. Gestational transient thyrotoxicosis due to hyperemesis gravidarum per     endocrinology consultation.  7. Elevated hepatic transaminases with negative viral serologies for viral     hepatitis A, hepatitis B, hepatitis C and  HIV.   CONSULTATIONS:  1. Dorisann Frames, M.D., [facility] End[facility].  Felt the workup was in     support of gestational transient thyrotoxicosis due to hyperemesis as     opposed to Graves thyrotoxicosis on the basis of evaluation significant     for thyroid peroxidase antibody level is 31.3  (normal).  Thyroid     antiglobulin antibody was 30.  Free T3 level of 6.6.  TSH level is less     than 0.004 and a free T4 level of 3.21.  2. OB/GYN consult.  Dr. Lonell Face on 08/18/02 for an apparent IUP at 10     1/2 weeks by uncertain dates, elevated hepatic enzymes and vulvar ulcers.   DISCUSSION:  The patient was admitted 4/21 by Dr. Leander Rams through the  unassigned service at [facility].  She presented with intractable  nausea and  vomiting, clinically dehydrated and with elevated hepatic transaminases and  hyperbilirubinemia.  She is admitted for IV fluids and further evaluation  which included abdominal ultrasound that did reveal gallbladder sludging,  but no stones, gallbladder thickness and normal bile duct caliber.  An  obstetrical ultrasound was performed 4/25 revealing crown-rump length 68  correlating to gestational age of 13.0 weeks.  A BPD of 21.1 consistent with  13 week three day gestation and head circumference of 87 mm correlating with  EGA of [redacted] weeks, six days.  There was normal cardiac activity at 160  beats/minute.  Best estimate of EGA was 13 weeks, three days.  The patient  received IV therapy and antiemetics of Zofran, Phenergan and she seemed to  be better on the former.  She received pelvic exam with  appropriate cultures  that proved positive for herpes simplex virus.  Treated with Valtrex.  Upon  discharge, she will go home with MetroGel for treating the bacterial  vaginosis.  GC Chlamydia tests were negative.  Her OB screen was also  negative for HIV, pending is sickle cell strain, RPR pending.  Persistently  elevated hepatic transaminases were documented and at the time of discharge  the AST level is 311, ALT 613, alk phos 89 and total bili was 1.2.  It is  felt that these elevations should be watched periodically through the  pregnancy and will gradually improve.  The patient received case management  and dietary consultation and she is discharged on the following medications.   DISCHARGE MEDICATIONS:  1. Vitamin B6 25 mg one tablet daily.  2. Prenatal vitamins one tablet daily.  3. MetroGel Vagina one applicator full daily for six nights.   ACTIVITY:  No restrictions.   DIET:  Frequent small meals, fluids.   FOLLOW UP:  Dr. Dorisann Frames, endocrinology [phone] next Monday at 10  a.m.  OB followup either with Lonell Face consulting physician.  His office  number is  [phone].  If she prefers, Dr. Mckinley Jewel of [facility] at [phone].   CONDITION AT DISCHARGE:  Tolerating regular diet with normal bowel and  bladder functions.                                               Lilyan Punt Sydnee Levans, M.D.    KCS/MEDQ  D:  08/27/2002  T:  08/27/2002  Job:  [id number]   cc:   Dorisann Frames, M.D.  [place] [address], [place] [address]  Fax: [phone]   Rudy Jew. Ashley Royalty, M.D.  [address]  [place], [place] [address]  Fax: [phone]   Ronda Fairly. Galen Daft, M.D.  [address], [address]  [place]  [place] [address]  Fax: [phone]   Maternity Admissions at [facility]

## 2010-09-13 NOTE — Op Note (Signed)
NAME:  Rebecca Levine, Rebecca Levine                     ACCOUNT NO.:  [id number]   MEDICAL RECORD NO.:  [id number]                   PATIENT TYPE:  AMB   LOCATION:  SDC                                  FACILITY:  [facility]   PHYSICIAN:  Miguel Aschoff, M.D.                    DATE OF BIRTH:  February 28, 1981   DATE OF PROCEDURE:  07/09/2003  DATE OF DISCHARGE:  07/09/2003                                 OPERATIVE REPORT   PREOPERATIVE DIAGNOSES:  Retained products of conception, status post  therapeutic abortion.   POSTOPERATIVE DIAGNOSES:  Retained products of conception, status post  therapeutic abortion.   PROCEDURE:  Repeat suction curettage.   SURGEON:  Miguel Aschoff, M.D.   ANESTHESIA:  IV sedation with paracervical block.   COMPLICATIONS:  None.   JUSTIFICATION:  The patient is a 30 year old black female who underwent  elective termination of pregnancy on July 07, 2003.  This procedure was  carried out without complications; however, the patient developed the onset  of abdominal pain and bleeding on July 08, 2003, that progressed in  symptomatology through the day.  She was evaluated at the [facility].  Ultrasound examination was carried out and did reveal what appeared to be  retained products of conception.  In view of this finding, she was being  taken to the operating room to undergo evacuation of the uterus.   DESCRIPTION OF PROCEDURE:  The patient was taken to the operating room,  placed in the supine position.  IV sedation was administered without  difficulty.  She was then placed in the dorsal lithotomy position; prepped  and draped in the usual sterile fashion.  The bladder was catheterized.  The  examination revealed normal external genitalia.  No Bartholin's, urethral or  Skene's glands.  The vaginal vault had a small amount of blood and the  cervical os appeared to be closed.  The uterus appeared in the anterior and  normal in size.  The adnexa showed no definite masses.   At this point, the cervix was injected with 18 cc of 1% Xylocaine; by  placing 9 cc in each of the 12 and 6 o'clock positions on the cervix.  This  was grasped with a tenaculum and held.  Then the canal was fairly dilated  using cervical Pratt dilators up to where a 25 Pratt dilator could be  passed.  Once this was done, a curet was placed in the cavity, a small  amount of tissue was removed.  Following this, sharp curettage was carried  out; noting again only a small amount of additional tissue being removed.  One and final pass was made with suction curet.  No additional tissue within  the confines of the uterine cavity, and the procedure was completed.   Hemostasis was readily achieved.  All instrument counts, lap counts were  taken down and found to be correct.  From this point the patient  was placed  in the right side up position, and brought to the recovery room in  satisfactory condition.   ESTIMATED BLOOD LOSS:  Approximately 30 cc.   PLAN:  The plan is for the patient to be discharged home.   HOME MEDICATIONS:  1. Doxycycline 100 mg b.i.d. x7 days.  2. Darvocet N 100 one q.4h. p.r.n. pain.   FOLLOWUP:  The patient was instructed to return to office in two weeks, and  will call if there is any problems such as fever, pain or heavy bleeding.                                               Miguel Aschoff, M.D.    AR/MEDQ  D:  07/09/2003  T:  07/09/2003  Job:  [id number]

## 2010-09-13 NOTE — Discharge Summary (Signed)
NAME:  Rebecca Levine, Rebecca Levine           ACCOUNT NO.:  [id number]   MEDICAL RECORD NO.:  [id number]          PATIENT TYPE:  INP   LOCATION:  9119                          [facility]   PHYSICIAN:  Ilda Mori, M.D.   DATE OF BIRTH:  Jul 30, 1980   DATE OF ADMISSION:  08/29/2007  DATE OF DISCHARGE:  09/01/2007                               DISCHARGE SUMMARY   FINAL DIAGNOSIS:  Intrauterine pregnancy at term, active labor, active  labial herpes lesions.   PROCEDURE:  Primary low transverse cesarean section.   SURGEON:  Malva Limes, MD.   COMPLICATIONS:  None.   This is a 30 year old G4, P 1-0-2-1 presents at term in active labor.  The patient's antepartum course been complicated by a history of  fibroids and asthma.  The patient otherwise had an uncomplicated  antepartum course.  She did have a positive group B strep culture  obtained in our office at 36 weeks.  Upon admission, on exam by Dr. Malva Limes noticed there is a suspicious lesion that looked like a herpes  lesion on her left labia.  The patient said this is a reoccurring  problem over the last 2 years but no actual diagnosis had been made.  A  discussion was held with the patient and decision was made to proceed  with a cesarean section secondary to the possible lesion.  The patient  was taken to the operating room by Dr. Malva Limes on July 30, 2007,  where a primary low transverse cesarean section was performed with the  delivery of a 6 pounds 1 ounce female infant with Apgars of 9 and 9.  Delivery went without complications.  The patient's postoperative course  was benign without any significant fevers.  The patient was felt ready  for discharge on postoperative day #3.  She was sent home on a regular  diet, told to decrease activities, told to continue her vitamins.  She  was given Tylox #20 1-2 every 4 to 6 hours as needed for pain and was to  follow up in our office in 4 weeks.   LABS ON DISCHARGE:  The  patient had a hemoglobin of 10.4, white blood  cell count of 10.4 as well and platelets of 227,000.  The herpes culture  that was obtained actually did not detect herpes simplex virus.  The  patient will need to have follow up serology for that.      Leilani Able, P.A.-C.      Ilda Mori, M.D.  Electronically Signed    MB/MEDQ  D:  09/16/2007  T:  09/17/2007  Job:  [id number]

## 2011-01-15 LAB — URINE MICROSCOPIC-ADD ON

## 2011-01-15 LAB — COMPREHENSIVE METABOLIC PANEL
ALT: 39 — ABNORMAL HIGH
Albumin: 3.2 — ABNORMAL LOW
Calcium: 8.9
Glucose, Bld: 84
Sodium: 137
Total Protein: 6.8

## 2011-01-15 LAB — CBC
Hemoglobin: 12.4
MCHC: 33.8
Platelets: 286
RDW: 14.3

## 2011-01-15 LAB — URINALYSIS, ROUTINE W REFLEX MICROSCOPIC
Bilirubin Urine: NEGATIVE
Hgb urine dipstick: NEGATIVE
Ketones, ur: NEGATIVE
Nitrite: NEGATIVE
Nitrite: NEGATIVE
Protein, ur: NEGATIVE
Specific Gravity, Urine: >1.03 — ABNORMAL HIGH
Urobilinogen, UA: 2 — ABNORMAL HIGH
Urobilinogen, UA: 2 — ABNORMAL HIGH
pH: 6

## 2011-01-15 LAB — AST: AST: 29

## 2011-01-15 LAB — T4: T4, Total: 12.4

## 2011-01-15 LAB — AMYLASE: Amylase: 95

## 2011-01-15 LAB — TSH: TSH: 0.134 — ABNORMAL LOW

## 2011-01-20 LAB — CBC
HCT: 33.4 — ABNORMAL LOW
Hemoglobin: 11.2 — ABNORMAL LOW
MCV: 87
Platelets: 266
RBC: 3.84 — ABNORMAL LOW
WBC: 10.1

## 2011-01-20 LAB — DIFFERENTIAL
Eosinophils Relative: 1
Lymphocytes Relative: 12
Lymphs Abs: 1.2
Monocytes Absolute: 0.6
Monocytes Relative: 6

## 2011-01-20 LAB — URINALYSIS, ROUTINE W REFLEX MICROSCOPIC
Glucose, UA: NEGATIVE
Nitrite: NEGATIVE
Specific Gravity, Urine: 1.025
pH: 6.5

## 2011-01-20 LAB — WET PREP, GENITAL
Trich, Wet Prep: NONE SEEN
Yeast Wet Prep HPF POC: NONE SEEN

## 2011-01-20 LAB — COMPREHENSIVE METABOLIC PANEL
AST: 59 — ABNORMAL HIGH
CO2: 21
Chloride: 103
Creatinine, Ser: 0.39 — ABNORMAL LOW
GFR calc Af Amer: >60
GFR calc non Af Amer: >60
Glucose, Bld: 84
Total Bilirubin: 1.6 — ABNORMAL HIGH

## 2011-01-20 LAB — STREP B DNA PROBE

## 2011-01-27 LAB — POCT RAPID STREP A: Streptococcus, Group A Screen (Direct): NEGATIVE

## 2011-01-30 LAB — STREP A DNA PROBE

## 2011-01-31 LAB — URINALYSIS, ROUTINE W REFLEX MICROSCOPIC
Glucose, UA: 100 — AB
Nitrite: NEGATIVE
Specific Gravity, Urine: >1.03 — ABNORMAL HIGH
pH: 5.5
pH: 6.5

## 2011-02-03 LAB — URINALYSIS, ROUTINE W REFLEX MICROSCOPIC
Nitrite: NEGATIVE
Specific Gravity, Urine: >1.03 — ABNORMAL HIGH
Urobilinogen, UA: 1

## 2011-02-04 LAB — URINALYSIS, ROUTINE W REFLEX MICROSCOPIC
Glucose, UA: NEGATIVE
Hgb urine dipstick: NEGATIVE
Ketones, ur: >80 — AB
Nitrite: NEGATIVE
Protein, ur: NEGATIVE
Protein, ur: NEGATIVE
Specific Gravity, Urine: >1.03 — ABNORMAL HIGH
Urobilinogen, UA: 1
Urobilinogen, UA: 0.2

## 2011-02-04 LAB — COMPREHENSIVE METABOLIC PANEL
ALT: 59 — ABNORMAL HIGH
AST: 38 — ABNORMAL HIGH
Albumin: 3.3 — ABNORMAL LOW
Alkaline Phosphatase: 73
Chloride: 100
Creatinine, Ser: 0.63
GFR calc Af Amer: >60
Potassium: 3.5
Sodium: 133 — ABNORMAL LOW
Total Bilirubin: 0.8

## 2011-02-04 LAB — CBC
HCT: 37.8
Hemoglobin: 12.5
RDW: 13.9

## 2011-02-04 LAB — URINE MICROSCOPIC-ADD ON

## 2011-02-04 LAB — URINE CULTURE: Colony Count: 5000

## 2011-02-06 LAB — URINALYSIS, ROUTINE W REFLEX MICROSCOPIC
Bilirubin Urine: NEGATIVE
Hgb urine dipstick: NEGATIVE
Protein, ur: NEGATIVE
Urobilinogen, UA: 1

## 2011-02-06 LAB — POCT PREGNANCY, URINE: Operator id: 114931

## 2011-09-28 ENCOUNTER — Emergency Department (HOSPITAL_COMMUNITY)
Admission: EM | Admit: 2011-09-28 | Discharge: 2011-09-28 | Disposition: A | Discharge status: Home / Self Care | Payer: Medicaid Other | Attending: Emergency Medicine | Admitting: Emergency Medicine

## 2011-09-28 ENCOUNTER — Encounter (HOSPITAL_COMMUNITY): Payer: Self-pay | Admitting: Emergency Medicine

## 2011-09-28 DIAGNOSIS — R112 Nausea with vomiting, unspecified: Secondary | ICD-10-CM | POA: Insufficient documentation

## 2011-09-28 DIAGNOSIS — J45909 Unspecified asthma, uncomplicated: Secondary | ICD-10-CM | POA: Insufficient documentation

## 2011-09-28 DIAGNOSIS — N12 Tubulo-interstitial nephritis, not specified as acute or chronic: Secondary | ICD-10-CM | POA: Insufficient documentation

## 2011-09-28 DIAGNOSIS — R197 Diarrhea, unspecified: Secondary | ICD-10-CM | POA: Insufficient documentation

## 2011-09-28 LAB — URINALYSIS, ROUTINE W REFLEX MICROSCOPIC
Glucose, UA: NEGATIVE mg/dL
pH: 6 (ref 5.0–8.0)

## 2011-09-28 LAB — URINE MICROSCOPIC-ADD ON

## 2011-09-28 LAB — POCT PREGNANCY, URINE: Preg Test, Ur: NEGATIVE

## 2011-09-28 MED ORDER — SODIUM CHLORIDE 0.9 % IV BOLUS (SEPSIS)
1000.0000 mL | Freq: Once | INTRAVENOUS | Status: AC
  Administered 2011-09-28: 1000 mL via INTRAVENOUS

## 2011-09-28 MED ORDER — DEXTROSE 5 % IV SOLN
1.0000 g | Freq: Once | INTRAVENOUS | Status: AC
  Administered 2011-09-28: 1 g via INTRAVENOUS
  Filled 2011-09-28: qty 10

## 2011-09-28 MED ORDER — KETOROLAC TROMETHAMINE 30 MG/ML IJ SOLN
30.0000 mg | Freq: Once | INTRAMUSCULAR | Status: AC
  Administered 2011-09-28: 30 mg via INTRAMUSCULAR
  Filled 2011-09-28: qty 1

## 2011-09-28 MED ORDER — CIPROFLOXACIN HCL 500 MG PO TABS: 500.0000 mg | ORAL_TABLET | Freq: Two times a day (BID) | ORAL | Status: AC

## 2011-09-28 MED ORDER — ONDANSETRON 4 MG PO TBDP
8.0000 mg | ORAL_TABLET | Freq: Once | ORAL | Status: AC
  Administered 2011-09-28: 8 mg via ORAL
  Filled 2011-09-28: qty 2

## 2011-09-28 NOTE — ED Notes (Addendum)
C/o fever, chills, headache, nausea, back pain, and generalized body aches since Saturday morning at 1am.  Pt reports she fell down steps on Monday and landed on buttocks.

## 2011-09-28 NOTE — ED Provider Notes (Signed)
History  Scribed for Nat Christen, MD, the patient was seen in room STRE3/STRE3. This chart was scribed by Candelaria Stagers. The patient's care started at 5:45 PM    CSN: [id number]  Arrival date & time 09/28/11  1626   First MD Initiated Contact with Patient 09/28/11 1735      Chief Complaint  Patient presents with  . Generalized Body Aches    HPI Rebecca Levine is a 31 y.o. female who presents to the Emergency Department complaining of nausea for the last two days.  She also experiencing body aches, headaches, and fever, which was 102.4 three days ago.  She denies sore throat, abdominal pain, or dysuria.  She reports that she has taken tylenol with little relief.  Pt has a h/o asthma.  Past Medical History  Diagnosis Date  . Asthma     Past Surgical History  Procedure Date  . Cesarean section     No family history on file.  History  Substance Use Topics  . Smoking status: Never Smoker   . Smokeless tobacco: Not on file  . Alcohol Use: Yes    OB History    Grav Para Term Preterm Abortions TAB SAB Ect Mult Living                  Review of Systems  Constitutional: Positive for fever. Negative for chills.       Generalized body aches  HENT: Negative for sore throat.   Eyes: Negative.  Negative for discharge and redness.  Respiratory: Negative.  Negative for cough and shortness of breath.   Cardiovascular: Negative.  Negative for chest pain.  Gastrointestinal: Positive for nausea, vomiting and diarrhea. Negative for abdominal pain.  Genitourinary: Positive for flank pain. Negative for dysuria and vaginal discharge.  Musculoskeletal: Positive for back pain.  Skin: Negative.  Negative for color change and rash.  Neurological: Positive for headaches. Negative for syncope.  Hematological: Negative.  Negative for adenopathy.  Psychiatric/Behavioral: Negative.  Negative for confusion.  All other systems reviewed and are negative.    Allergies   Aspirin  Home Medications   Current Outpatient Rx  Name Route Sig Dispense Refill  . ACETAMINOPHEN 160 MG/5ML PO ELIX Oral Take 960 mg by mouth every 4 (four) hours as needed. For fever      BP 102/63  Pulse 113  Temp(Src) 98.8 F (37.1 C) (Oral)  Resp 16  SpO2 100%  LMP 09/18/2011  Physical Exam  Nursing note and vitals reviewed. Constitutional: She is oriented to person, place, and time. She appears well-developed and well-nourished. No distress.  HENT:  Head: Normocephalic and atraumatic.  Mouth/Throat: Oropharynx is clear and moist. No oropharyngeal exudate.  Eyes: EOM are normal. Pupils are equal, round, and reactive to light.  Neck: Neck supple. No tracheal deviation present.  Cardiovascular: Normal rate and regular rhythm.   Pulmonary/Chest: Effort normal. No respiratory distress. She has no wheezes. She has no rales.  Abdominal: Soft. She exhibits no distension. There is no tenderness.  Musculoskeletal: Normal range of motion. She exhibits no edema.       Mild tenderness along her paraspinal muscles.   Neurological: She is alert and oriented to person, place, and time.  Skin: Skin is warm and dry.  Psychiatric: She has a normal mood and affect. Her behavior is normal.    ED Course  Procedures   DIAGNOSTIC STUDIES: Oxygen Saturation is 100% on room air, normal by my interpretation.    COORDINATION  OF CARE: 5:49PM Ordered: Pregnancy, urine; Urinalysis, Routine w reflex microscopic; POCT Pregnancy     Labs Reviewed  URINALYSIS, ROUTINE W REFLEX MICROSCOPIC - Abnormal; Notable for the following:    APPearance CLOUDY (*)    Hgb urine dipstick MODERATE (*)    Bilirubin Urine SMALL (*)    Ketones, ur >80 (*)    Protein, ur 100 (*)    Leukocytes, UA LARGE (*)    All other components within normal limits  URINE MICROSCOPIC-ADD ON - Abnormal; Notable for the following:    Bacteria, UA MANY (*)    All other components within normal limits  POCT PREGNANCY,  URINE     MDM  Patient with urinalysis that is consistent with UTI.  Given patient's other clinical symptoms she appears to have pyelonephritis.  Patient will receive an IV and received a liter of normal saline given her tachycardia.  Shows receive a dose of ceftriaxone to begin treatment for her UTI.  Patient can tolerate by mouth she'll be able to be discharged home with ciprofloxacin for one week her treatment for her pyelonephritis.  I personally performed the services described in this documentation, which was scribed in my presence. The recorded information has been reviewed and considered.       Nat Christen, MD 09/28/11 [phone]

## 2011-09-28 NOTE — ED Provider Notes (Signed)
8:14 PM Patient care assumed from Dr. Golda Acre. Pt with possible pyelonephritis; UA appears c/w UTI. Has reported n/v at home; has not had any while here. She was mildly tachycardic. Plan is to administer fluids, Rocephin, antiemetics. Provided she can tolerate PO challenge after this, she can be discharged home.  9:00 PM Pt was feeling better after IV abx and fluids and was anxious to be discharged home. HR decreased with fluids. She was discharged by nursing staff prior to my reassessment but was reportedly able to tolerate PO. Return precautions per discharge instructions.  Deyonna Fitzsimmons, [place] 09/28/11 2132

## 2011-09-28 NOTE — Discharge Instructions (Signed)
Pyelonephritis, Adult Pyelonephritis is a kidney infection. In general, there are 2 main types of pyelonephritis:  Infections that come on quickly without any warning (acute pyelonephritis).   Infections that persist for a long period of time (chronic pyelonephritis).  CAUSES  Two main causes of pyelonephritis are:  Bacteria traveling from the bladder to the kidney. This is a problem especially in pregnant women. The urine in the bladder can become filled with bacteria from multiple causes, including:   Inflammation of the prostate gland (prostatitis).   Sexual intercourse in females.   Bladder infection (cystitis).   Bacteria traveling from the bloodstream to the tissue part of the kidney.  Problems that may increase your risk of getting a kidney infection include:  Diabetes.   Kidney stones or bladder stones.   Cancer.   Catheters placed in the bladder.   Other abnormalities of the kidney or ureter.  SYMPTOMS   Abdominal pain.   Pain in the side or flank area.   Fever.   Chills.   Upset stomach.   Blood in the urine (dark urine).   Frequent urination.   Strong or persistent urge to urinate.   Burning or stinging when urinating.  DIAGNOSIS  Your caregiver may diagnose your kidney infection based on your symptoms. A urine sample may also be taken. TREATMENT  In general, treatment depends on how severe the infection is.   If the infection is mild and caught early, your caregiver may treat you with oral antibiotics and send you home.   If the infection is more severe, the bacteria may have gotten into the bloodstream. This will require intravenous (IV) antibiotics and a hospital stay. Symptoms may include:   High fever.   Severe flank pain.   Shaking chills.   Even after a hospital stay, your caregiver may require you to be on oral antibiotics for a period of time.   Other treatments may be required depending upon the cause of the infection.  HOME CARE  INSTRUCTIONS   Take your antibiotics as directed. Finish them even if you start to feel better.   Make an appointment to have your urine checked to make sure the infection is gone.   Drink enough fluids to keep your urine clear or pale yellow.   Take medicines for the bladder if you have urgency and frequency of urination as directed by your caregiver.  SEEK IMMEDIATE MEDICAL CARE IF:   You have a fever.   You are unable to take your antibiotics or fluids.   You develop shaking chills.   You experience extreme weakness or fainting.   There is no improvement after 2 days of treatment.  MAKE SURE YOU:  Understand these instructions.   Will watch your condition.   Will get help right away if you are not doing well or get worse.  Document Released: 04/14/2005 Document Revised: 04/03/2011 Document Reviewed: 09/18/2010 [employer] Patient Information 2012 [employer]. 

## 2011-10-02 NOTE — ED Provider Notes (Signed)
Medical screening examination/treatment/procedure(s) were performed by non-physician practitioner and as supervising physician I was immediately available for consultation/collaboration.   [place], MD 10/02/11 0717 

## 2011-10-26 IMAGING — US US OB COMP LESS 14 WK
1 series · 14 of 28 positions shown · non-contrast
Comparison: none

OBSTETRICAL ULTRASOUND:
 This ultrasound exam was performed in the [HOSPITAL] Ultrasound Department.  The OB US report was generated in the AS system, and faxed to the ordering physician.  This report is also available in [HOSPITAL]?s [employer] and in [REDACTED] PACS.

[Series 1: us ob comp less 14 wks · 0.27mm/px · 14 of 42 slices shown]
[im 2/42]
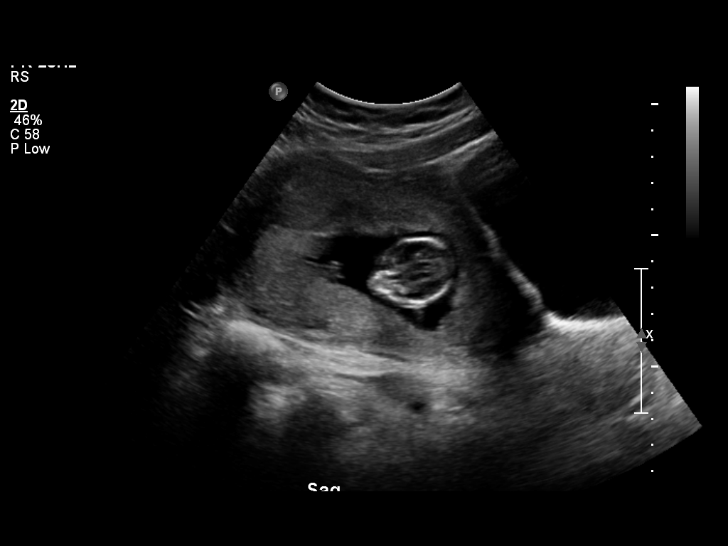
[im 5/42]
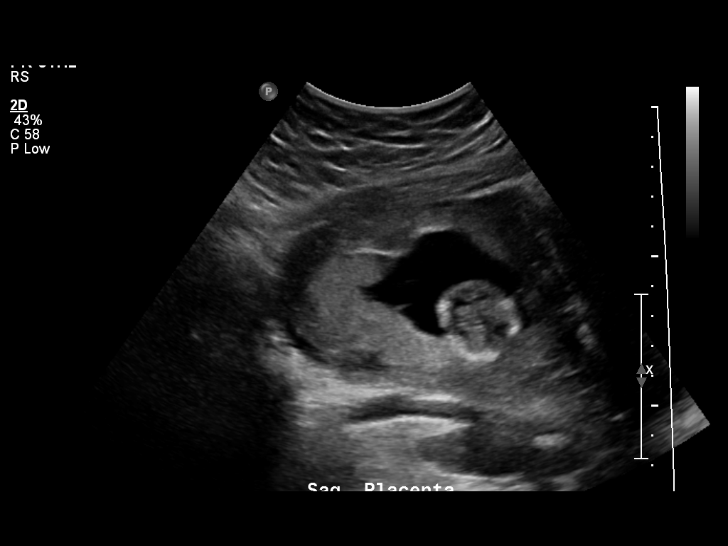
[im 8/42]
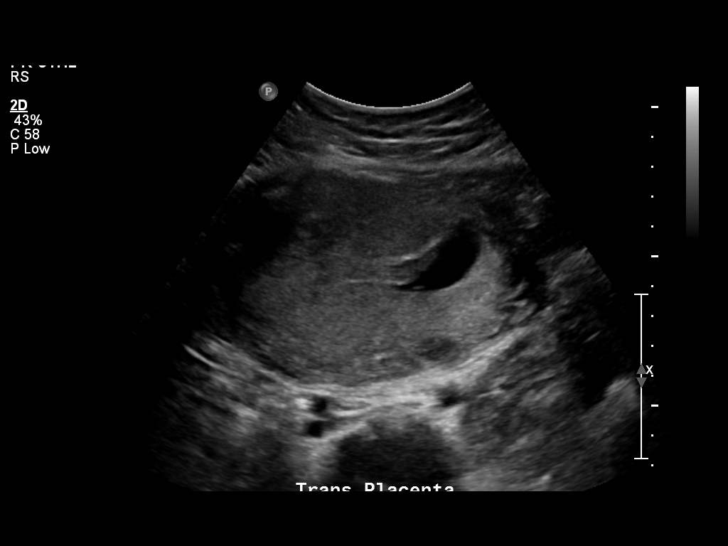
[im 11/42]
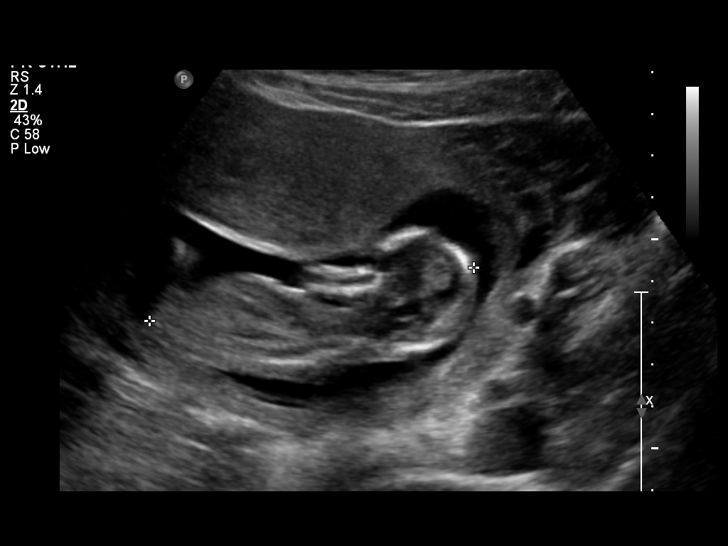
[im 14/42]
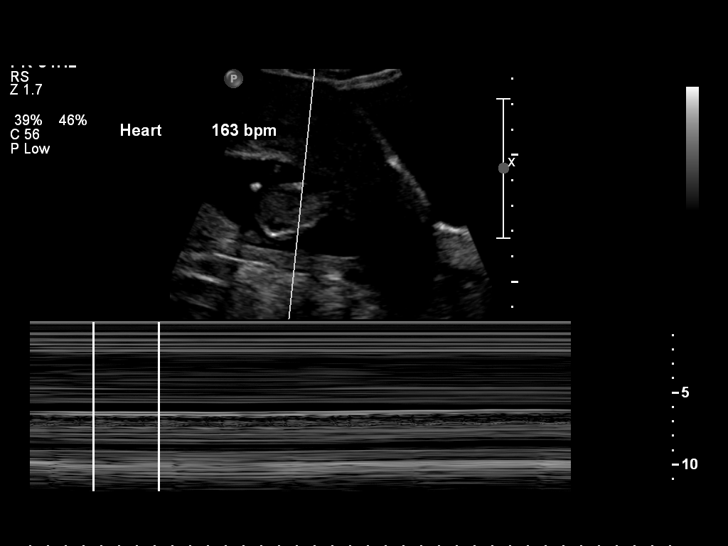
[im 17/42]
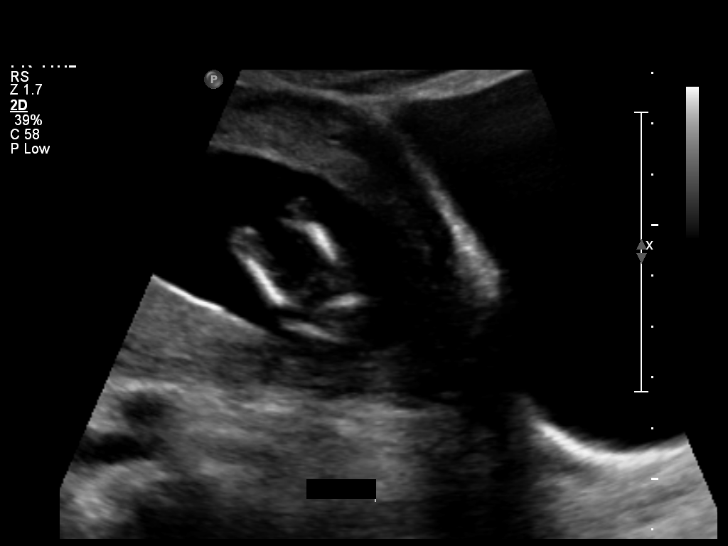
[im 20/42]
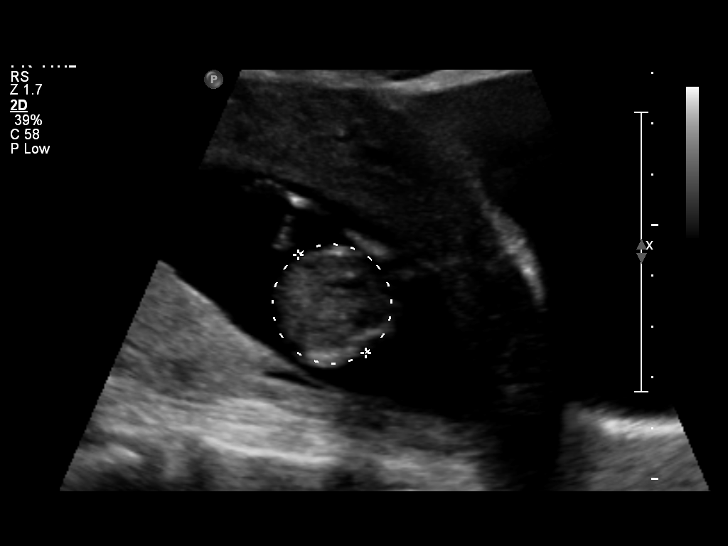
[im 23/42]
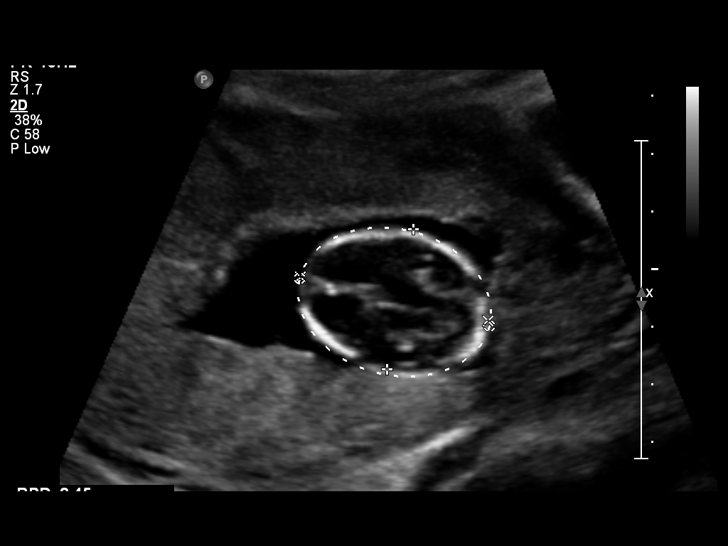
[im 26/42]
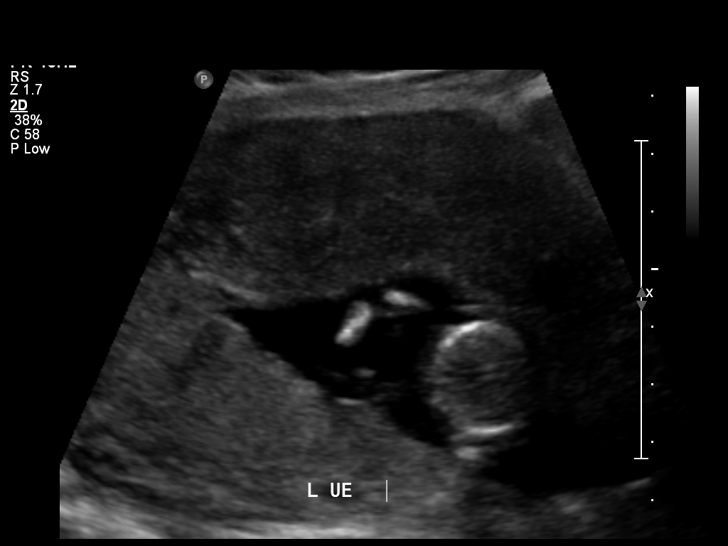
[im 29/42]
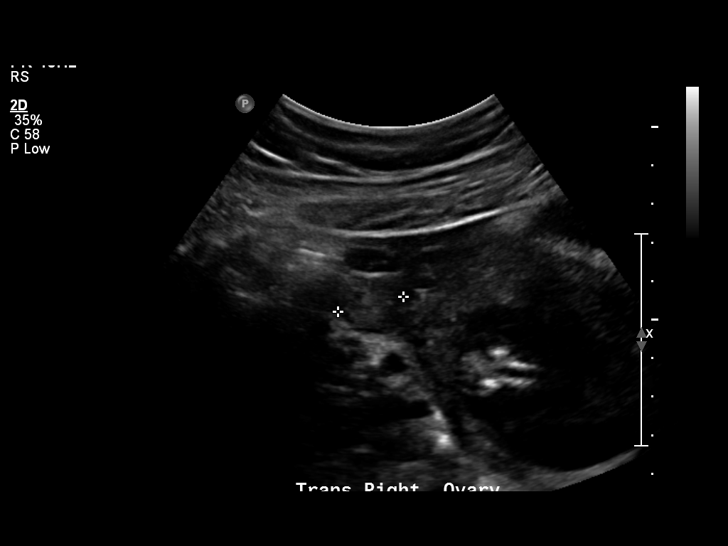
[im 32/42]
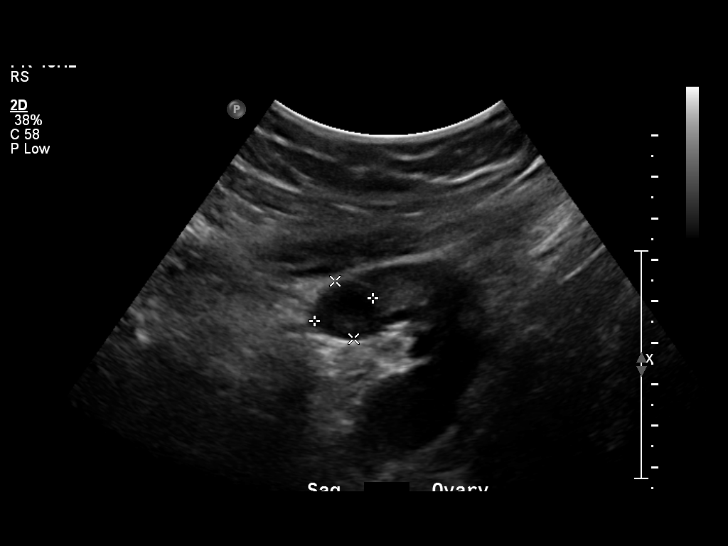
[im 35/42]
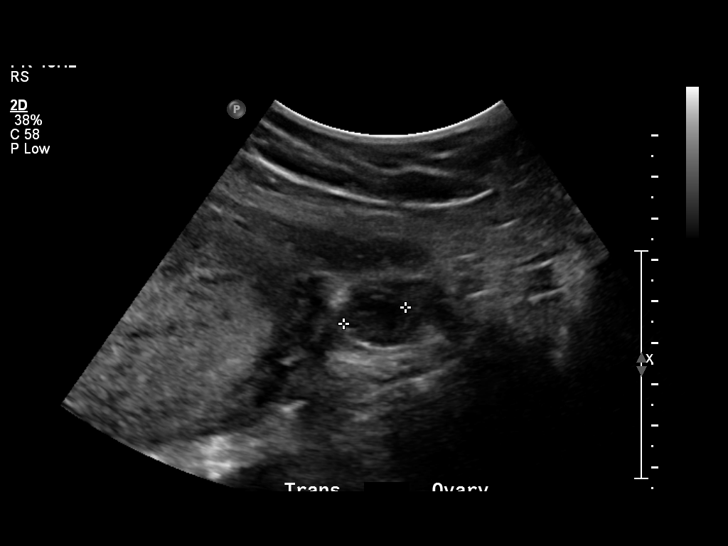
[im 38/42]
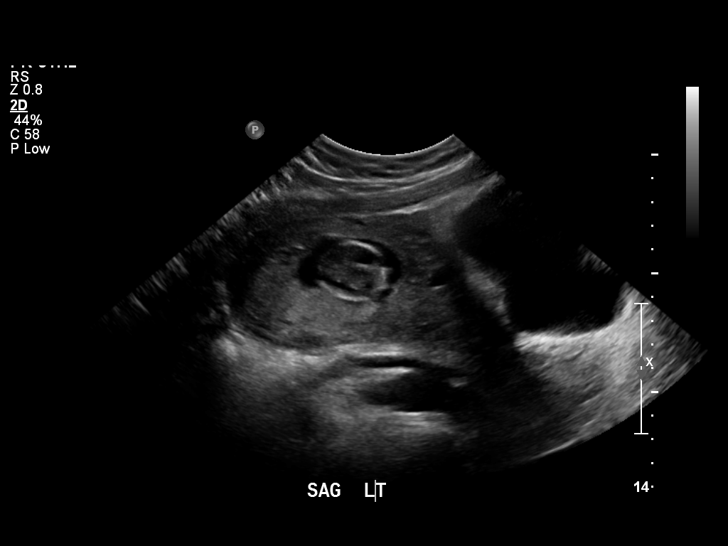
[im 42/42]
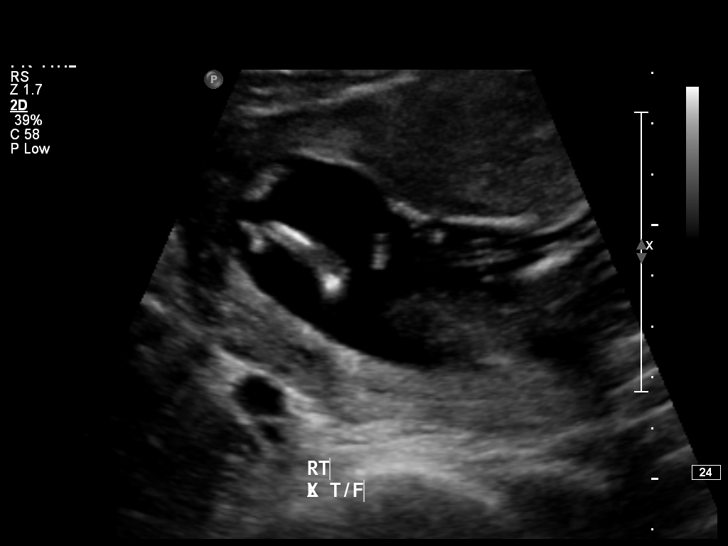

[14 of 28 positions shown; findings below may reference images not displayed]

IMPRESSION: See AS Obstetric US report.

## 2011-11-25 ENCOUNTER — Emergency Department (HOSPITAL_COMMUNITY)
Admission: EM | Admit: 2011-11-25 | Discharge: 2011-11-25 | Disposition: A | Discharge status: Home / Self Care | Payer: Medicaid Other | Source: Home / Self Care | Attending: Emergency Medicine | Admitting: Emergency Medicine

## 2011-11-25 ENCOUNTER — Encounter (HOSPITAL_COMMUNITY): Payer: Self-pay | Admitting: *Deleted

## 2011-11-25 DIAGNOSIS — S058X9A Other injuries of unspecified eye and orbit, initial encounter: Secondary | ICD-10-CM

## 2011-11-25 DIAGNOSIS — S0501XA Injury of conjunctiva and corneal abrasion without foreign body, right eye, initial encounter: Secondary | ICD-10-CM

## 2011-11-25 MED ORDER — TETRACAINE HCL 0.5 % OP SOLN
1.0000 [drp] | Freq: Once | OPHTHALMIC | Status: AC
  Administered 2011-11-25: 1 [drp] via OPHTHALMIC

## 2011-11-25 MED ORDER — POLYETHYL GLYCOL-PROPYL GLYCOL 0.4-0.3 % OP SOLN: 1.0000 [drp] | Freq: Four times a day (QID) | OPHTHALMIC | Status: DC | PRN

## 2011-11-25 MED ORDER — TETRACAINE HCL 0.5 % OP SOLN
OPHTHALMIC | Status: AC
  Filled 2011-11-25: qty 2

## 2011-11-25 MED ORDER — MOXIFLOXACIN HCL 0.5 % OP SOLN: 1.0000 [drp] | Freq: Three times a day (TID) | OPHTHALMIC | Status: AC

## 2011-11-25 NOTE — ED Provider Notes (Signed)
History     CSN: [id number]  Arrival date & time 11/25/11  1516   First MD Initiated Contact with Patient 11/25/11 1648      Chief Complaint  Patient presents with  . Eye Problem    (Consider location/radiation/quality/duration/timing/severity/associated sxs/prior treatment) HPI Comments: Patient states that an insect flew into her right eye earlier this morning, now reports right eye pain, redness, foreign body sensation. States she washed it out with water, but feels that "there is something stuck in there". States that she's been rubbing her eye all day long. Now reports eye discharge. Wears glasses. Does not wear contacts.  Patient is a 31 y.o. female presenting with eye problem. The history is provided by the patient. No language interpreter was used.  Eye Problem  This is a new problem. The current episode started 3 to 5 hours ago. The problem occurs constantly. The problem has not changed since onset.There is pain in the right eye. The injury mechanism was a foreign body. The pain is mild. There is history of trauma to the eye. There is no known exposure to pink eye. She does not wear contacts. Associated symptoms include discharge, foreign body sensation and eye redness. Pertinent negatives include no blurred vision, no decreased vision, no double vision, no photophobia, no nausea and no vomiting. She has tried water for the symptoms. The treatment provided no relief.    Past Medical History  Diagnosis Date  . Asthma     Past Surgical History  Procedure Date  . Cesarean section     History reviewed. No pertinent family history.  History  Substance Use Topics  . Smoking status: Never Smoker   . Smokeless tobacco: Not on file  . Alcohol Use: Yes    OB History    Grav Para Term Preterm Abortions TAB SAB Ect Mult Living                  Review of Systems  Eyes: Positive for discharge and redness. Negative for blurred vision, double vision and photophobia.    Gastrointestinal: Negative for nausea and vomiting.    Allergies  Aspirin  Home Medications   Current Outpatient Rx  Name Route Sig Dispense Refill  . ACETAMINOPHEN 160 MG/5ML PO ELIX Oral Take 960 mg by mouth every 4 (four) hours as needed. For fever    . MOXIFLOXACIN HCL 0.5 % OP SOLN Both Eyes Place 1 drop into both eyes 3 (three) times daily. X 7 days 3 mL 0  . POLYETHYL GLYCOL-PROPYL GLYCOL 0.4-0.3 % OP SOLN Ophthalmic Apply 1 drop to eye 4 (four) times daily as needed. 5 mL 0    BP 134/87  Pulse 81  Temp 98.5 F (36.9 C) (Oral)  Resp 16  SpO2 100%  LMP 10/27/2011  Physical Exam  Nursing note and vitals reviewed. Constitutional: She is oriented to person, place, and time. She appears well-developed and well-nourished. No distress.  HENT:  Head: Normocephalic and atraumatic.  Eyes: EOM are normal. Pupils are equal, round, and reactive to light. No foreign bodies found. Right eye exhibits discharge. Right eye exhibits no chemosis. No foreign body present in the right eye. Right conjunctiva is injected.  Slit lamp exam:      The right eye shows corneal abrasion and fluorescein uptake. The right eye shows no corneal ulcer and no foreign body.         R Distance:  20/70  uncorrected ;  L Distance:  20/50 uncorrected. Patient states  this is baseline for her. Large corneal abrasion, see drawing.  Neck: Normal range of motion.  Cardiovascular: Normal rate.   Pulmonary/Chest: Effort normal.  Abdominal: She exhibits no distension.  Musculoskeletal: Normal range of motion.  Neurological: She is alert and oriented to person, place, and time. Coordination normal.  Skin: Skin is warm and dry.  Psychiatric: She has a normal mood and affect. Her behavior is normal. Judgment and thought content normal.    ED Course  Procedures (including critical care time)  Labs Reviewed - No data to display No results found.   1. Corneal abrasion, right     MDM  No evidence of retained  foreign body. Irrigated it out copiously with sterile saline. Will have her followup with her ophthalmologist, Dr. Mitzi Davenport in 2 days for recheck. Home with Vigamox and Systane eyedrops. Discussed signs and symptoms that should prompt return. Patient agrees with plan.    Luiz Blare, MD 11/25/11 [phone]

## 2011-11-25 NOTE — ED Notes (Signed)
Pt  Reports  This  Am  A  Bug  May  Have  Flown  Into  Her  R   Eye   The  Eye  Is  Somewhat red  Irritated  And  Watery

## 2014-06-21 ENCOUNTER — Emergency Department (HOSPITAL_COMMUNITY)
Admission: EM | Admit: 2014-06-21 | Discharge: 2014-06-21 | Disposition: A | Discharge status: Home / Self Care | Payer: BC Managed Care – PPO | Attending: Emergency Medicine | Admitting: Emergency Medicine

## 2014-06-21 ENCOUNTER — Encounter (HOSPITAL_COMMUNITY): Payer: Self-pay | Admitting: Emergency Medicine

## 2014-06-21 DIAGNOSIS — R69 Illness, unspecified: Secondary | ICD-10-CM

## 2014-06-21 DIAGNOSIS — J111 Influenza due to unidentified influenza virus with other respiratory manifestations: Secondary | ICD-10-CM | POA: Insufficient documentation

## 2014-06-21 DIAGNOSIS — J45909 Unspecified asthma, uncomplicated: Secondary | ICD-10-CM | POA: Insufficient documentation

## 2014-06-21 DIAGNOSIS — R52 Pain, unspecified: Secondary | ICD-10-CM | POA: Diagnosis present

## 2014-06-21 NOTE — ED Notes (Signed)
Pt c/o fever, cough and body aches x 3-4 days. Denies n/v/d. Last APAP 0200 650mg 

## 2014-06-21 NOTE — ED Provider Notes (Signed)
CSN: [id number]     Arrival date & time 06/21/14  0505 History   First MD Initiated Contact with Patient 06/21/14 [id number]     Chief Complaint  Patient presents with  . Generalized Body Aches     (Consider location/radiation/quality/duration/timing/severity/associated sxs/prior Treatment) HPI Complains of sore throat cough headache fever onset febrile 06/18/14. Feels like she has "the flu" treated with TheraFlu without relief. Maximum temperature 102. Nothing makes symptoms better or worse. No other associated symptoms. No shortness of breath. Past Medical History  Diagnosis Date  . Asthma    Past Surgical History  Procedure Laterality Date  . Cesarean section     No family history on file. History  Substance Use Topics  . Smoking status: Never Smoker   . Smokeless tobacco: Not on file  . Alcohol Use: No   no drug use OB History    No data available     Review of Systems  Constitutional: Positive for fever.  HENT: Positive for rhinorrhea, sneezing and sore throat.   Respiratory: Positive for cough.   Cardiovascular: Negative.   Gastrointestinal: Negative.   Musculoskeletal: Positive for myalgias.  Skin: Negative.   Neurological: Negative.   Psychiatric/Behavioral: Negative.   All other systems reviewed and are negative.     Allergies  Aspirin  Home Medications   Prior to Admission medications   Medication Sig Start Date End Date Taking? Authorizing Provider  acetaminophen (TYLENOL) 500 MG tablet Take 1,000 mg by mouth every 6 (six) hours as needed (For fever/pain).   Yes Historical Provider, MD  Pheniramine-PE-APAP 20-10-325 MG PACK Take 1 Package by mouth every 6 (six) hours as needed (for cold symptoms).   Yes Historical Provider, MD  Phenyleph-Doxylamine-DM-APAP 5-6.25-10-325 MG/15ML LIQD Take 30 mLs by mouth at bedtime as needed (for cold symptoms.).   Yes Historical Provider, MD  Polyethyl Glycol-Propyl Glycol (SYSTANE) 0.4-0.3 % SOLN Apply 1 drop to eye 4  (four) times daily as needed. Patient not taking: Reported on 06/21/2014 11/25/11   Domenick GongAshley Mortenson, MD   BP 131/78 mmHg  Pulse 102  Temp(Src) 98.3 F (36.8 C) (Oral)  Resp 18  SpO2 100%  LMP 06/20/2014 Physical Exam  Constitutional: She appears well-developed and well-nourished. No distress.  HENT:  Head: Normocephalic and atraumatic.  Right Ear: External ear normal.  Left Ear: External ear normal.  Nasal congestion oral pharynx minimally reddened no exudate  Eyes: Conjunctivae are normal. Pupils are equal, round, and reactive to light.  Neck: Neck supple. No tracheal deviation present. No thyromegaly present.  Cardiovascular: Regular rhythm.   No murmur heard. Pulmonary/Chest: Effort normal and breath sounds normal.  Coughing  Abdominal: Soft. Bowel sounds are normal. She exhibits no distension. There is no tenderness.  Obese  Musculoskeletal: Normal range of motion. She exhibits no edema or tenderness.  Neurological: She is alert. Coordination normal.  Skin: Skin is warm and dry. No rash noted.  Psychiatric: She has a normal mood and affect.  Nursing note and vitals reviewed.   ED Course  Procedures (including critical care time) Labs Review Labs Reviewed - No data to display  Imaging Review No results found.   EKG Interpretation None      MDM  Plan Tylenol, rest. Referral [facility] for primary care physician Final diagnoses:  None   Diagnosis influenza-like illness     Doug SouSam Jaheim Canino, MD 06/21/14 [id number]

## 2014-06-21 NOTE — Discharge Instructions (Signed)
Get plenty of rest. Take Tylenol as directed every 4 hours while awake for aches or for temperature higher than 100.4. See an urgent care center if not better in 3 days. Return if worse for any reason. [facility] to get a primary care physician

## 2015-02-21 ENCOUNTER — Encounter (HOSPITAL_COMMUNITY): Payer: Self-pay | Admitting: *Deleted

## 2015-02-21 ENCOUNTER — Emergency Department (HOSPITAL_COMMUNITY)
Admission: EM | Admit: 2015-02-21 | Discharge: 2015-02-21 | Disposition: A | Discharge status: Home / Self Care | Payer: BC Managed Care – PPO | Attending: Physician Assistant | Admitting: Physician Assistant

## 2015-02-21 DIAGNOSIS — J45909 Unspecified asthma, uncomplicated: Secondary | ICD-10-CM | POA: Insufficient documentation

## 2015-02-21 DIAGNOSIS — B349 Viral infection, unspecified: Secondary | ICD-10-CM | POA: Diagnosis not present

## 2015-02-21 DIAGNOSIS — R51 Headache: Secondary | ICD-10-CM | POA: Diagnosis present

## 2015-02-21 MED ORDER — IBUPROFEN 800 MG PO TABS: 800.0000 mg | ORAL_TABLET | Freq: Three times a day (TID) | ORAL | Status: DC

## 2015-02-21 MED ORDER — GUAIFENESIN-CODEINE 100-10 MG/5ML PO SOLN: 5.0000 mL | Freq: Four times a day (QID) | ORAL | Status: DC | PRN

## 2015-02-21 MED ORDER — BENZONATATE 100 MG PO CAPS: 100.0000 mg | ORAL_CAPSULE | Freq: Three times a day (TID) | ORAL | Status: DC | PRN

## 2015-02-21 NOTE — ED Notes (Signed)
Pt reports having flu like symptoms x 2 days, having sore throat, headaches, cough, fever/chills, bodyaches. Denies n/v/d.

## 2015-02-21 NOTE — ED Provider Notes (Signed)
CSN: [id number]     Arrival date & time 02/21/15  1644 History   First MD Initiated Contact with Patient 02/21/15 1823     Chief Complaint  Patient presents with  . Headache  . Fever     (Consider location/radiation/quality/duration/timing/severity/associated sxs/prior Treatment) HPI   Patient is a 34 year old female presenting with upper respiratory infection symptoms. Patient has runny nose, cough, congestion. Patient had the symptoms for the last 3 days. She has mild fever at home. Her child had a similar thing last week. Patient had no nausea no vomiting. She is here because she is concerned she might have the flu.  She is been taking TheraFlu at home to help with the symptoms.  Past Medical History  Diagnosis Date  . Asthma    Past Surgical History  Procedure Laterality Date  . Cesarean section     History reviewed. No pertinent family history. Social History  Substance Use Topics  . Smoking status: Never Smoker   . Smokeless tobacco: None  . Alcohol Use: No   OB History    No data available     Review of Systems  Constitutional: Positive for fever and fatigue. Negative for activity change.  HENT: Negative for congestion.   Eyes: Negative for discharge.  Respiratory: Positive for cough.   Gastrointestinal: Negative for abdominal distention.  Genitourinary: Negative for dysuria.  Skin: Negative for rash.  Allergic/Immunologic: Negative for immunocompromised state.  Neurological: Positive for headaches.  Psychiatric/Behavioral: Negative for behavioral problems.      Allergies  Aspirin  Home Medications   Prior to Admission medications   Medication Sig Start Date End Date Taking? Authorizing Provider  acetaminophen (TYLENOL) 500 MG tablet Take 1,000 mg by mouth every 6 (six) hours as needed (For fever/pain).   Yes Historical Provider, MD  Phenyleph-Doxylamine-DM-APAP 5-6.25-10-325 MG/15ML LIQD Take 30 mLs by mouth at bedtime as needed (for cold  symptoms.).   Yes Historical Provider, MD  benzonatate (TESSALON PERLES) 100 MG capsule Take 1 capsule (100 mg total) by mouth 3 (three) times daily as needed for cough. 02/21/15   Courteney Lyn Mackuen, MD  guaiFENesin-codeine 100-10 MG/5ML syrup Take 5 mLs by mouth every 6 (six) hours as needed for cough. 02/21/15   Courteney Lyn Mackuen, MD  ibuprofen (ADVIL,MOTRIN) 800 MG tablet Take 1 tablet (800 mg total) by mouth 3 (three) times daily. 02/21/15   Courteney Lyn Mackuen, MD  Polyethyl Glycol-Propyl Glycol (SYSTANE) 0.4-0.3 % SOLN Apply 1 drop to eye 4 (four) times daily as needed. Patient not taking: Reported on 06/21/2014 11/25/11   Domenick Gong, MD   BP 129/87 mmHg  Pulse 76  Temp(Src) 99.1 F (37.3 C) (Oral)  Resp 16  Ht  (1.702 m)  Wt 190 lb (86.183 kg)  BMI 29.75 kg/m2  SpO2 100%  LMP 02/16/2015 Physical Exam  Constitutional: She is oriented to person, place, and time. She appears well-developed and well-nourished.  HENT:  Head: Normocephalic and atraumatic.  Erythematous turbinates.  Eyes: Conjunctivae are normal. Right eye exhibits no discharge.  Neck: Neck supple.  Cardiovascular: Normal rate, regular rhythm and normal heart sounds.   No murmur heard. Pulmonary/Chest: Effort normal and breath sounds normal. She has no wheezes. She has no rales.  Abdominal: Soft. She exhibits no distension. There is no tenderness.  Musculoskeletal: Normal range of motion.  Neurological: She is oriented to person, place, and time.  Skin: Skin is warm. She is not diaphoretic.  Psychiatric: She has a normal mood and  affect. Her behavior is normal.  Nursing note and vitals reviewed.   ED Course  Procedures (including critical care time) Labs Review Labs Reviewed - No data to display  Imaging Review No results found. I have personally reviewed and evaluated these images and lab results as part of my medical decision-making.   EKG Interpretation None      MDM   Final  diagnoses:  Viral syndrome    Patient is a very friendly 34 year old female here with upper respiratory symptoms. I think this likely is upper respiratory infection. Patient does not have very high fever. She appears very well on exam. She has normal vital signs. We will give her symptomatically care for home. Plan to have her take couple days off work and stay hydrated. She will return with any concerning symptoms.    Courteney Randall AnLyn Mackuen, MD 02/21/15 2037

## 2015-02-21 NOTE — Discharge Instructions (Signed)
Viral Infections °A viral infection can be caused by different types of viruses. Most viral infections are not serious and resolve on their own. However, some infections may cause severe symptoms and may lead to further complications. °SYMPTOMS °Viruses can frequently cause: °· Minor sore throat. °· Aches and pains. °· Headaches. °· Runny nose. °· Different types of rashes. °· Watery eyes. °· Tiredness. °· Cough. °· Loss of appetite. °· Gastrointestinal infections, resulting in nausea, vomiting, and diarrhea. °These symptoms do not respond to antibiotics because the infection is not caused by bacteria. However, you might catch a bacterial infection following the viral infection. This is sometimes called a "superinfection." Symptoms of such a bacterial infection may include: °· Worsening sore throat with pus and difficulty swallowing. °· Swollen neck glands. °· Chills and a high or persistent fever. °· Severe headache. °· Tenderness over the sinuses. °· Persistent overall ill feeling (malaise), muscle aches, and tiredness (fatigue). °· Persistent cough. °· Yellow, green, or brown mucus production with coughing. °HOME CARE INSTRUCTIONS  °· Only take over-the-counter or prescription medicines for pain, discomfort, diarrhea, or fever as directed by your caregiver. °· Drink enough water and fluids to keep your urine clear or pale yellow. Sports drinks can provide valuable electrolytes, sugars, and hydration. °· Get plenty of rest and maintain proper nutrition. Soups and broths with crackers or rice are fine. °SEEK IMMEDIATE MEDICAL CARE IF:  °· You have severe headaches, shortness of breath, chest pain, neck pain, or an unusual rash. °· You have uncontrolled vomiting, diarrhea, or you are unable to keep down fluids. °· You or your child has an oral temperature above 102° F (38.9° C), not controlled by medicine. °· Your baby is older than 3 months with a rectal temperature of 102° F (38.9° C) or higher. °· Your baby is 3  months old or younger with a rectal temperature of 100.4° F (38° C) or higher. °MAKE SURE YOU:  °· Understand these instructions. °· Will watch your condition. °· Will get help right away if you are not doing well or get worse. °  °This information is not intended to replace advice given to you by your health care provider. Make sure you discuss any questions you have with your health care provider. °  °Document Released: 01/22/2005 Document Revised: 07/07/2011 Document Reviewed: 09/20/2014 °Elsevier Interactive Patient Education ©2016 [employer] ° °

## 2017-07-27 ENCOUNTER — Emergency Department (HOSPITAL_COMMUNITY)
Admission: EM | Admit: 2017-07-27 | Discharge: 2017-07-27 | Disposition: A | Discharge status: Home / Self Care | Payer: BC Managed Care – PPO | Attending: Emergency Medicine | Admitting: Emergency Medicine

## 2017-07-27 ENCOUNTER — Encounter (HOSPITAL_COMMUNITY): Payer: Self-pay

## 2017-07-27 ENCOUNTER — Other Ambulatory Visit: Payer: Self-pay

## 2017-07-27 DIAGNOSIS — M549 Dorsalgia, unspecified: Secondary | ICD-10-CM

## 2017-07-27 DIAGNOSIS — J45909 Unspecified asthma, uncomplicated: Secondary | ICD-10-CM | POA: Insufficient documentation

## 2017-07-27 MED ORDER — CYCLOBENZAPRINE HCL 5 MG PO TABS: 5.0000 mg | ORAL_TABLET | Freq: Every evening | ORAL | 0 refills | Status: AC | PRN

## 2017-07-27 NOTE — ED Provider Notes (Signed)
[facility]-EMERGENCY DEPT Provider Note   CSN: [id number] Arrival date & time: 07/27/17  4098     History   Chief Complaint Chief Complaint  Patient presents with  . Back Pain    HPI Rebecca Levine is a 37 y.o. female presenting for evaluation of back pain.  Patient states her symptoms began 3 days ago.  She has pain of her upper back.  Worse with certain movements of her arms.  She denies fall, trauma, or injury.  This began all of a sudden when she woke up.  She has been taking Tylenol with mild improvement of the pain.  She has not tried anything else including ibuprofen.  Pain is described as a throbbing ache, it does not radiate. Pain is constant. It does not radiate.  She denies associated numbness or tingling.  She denies history of back problems.  She denies fevers, chills, chest pain, shortness of breath, loss of bowel or bladder control, history of cancer, history of IV drug use.  She has a history of asthma, takes no medications daily.  No other medical problems.  HPI  Past Medical History:  Diagnosis Date  . Asthma     There are no active problems to display for this patient.   Past Surgical History:  Procedure Laterality Date  . CESAREAN SECTION       OB History   None      Home Medications    Prior to Admission medications   Medication Sig Start Date End Date Taking? Authorizing Provider  benzonatate (TESSALON PERLES) 100 MG capsule Take 1 capsule (100 mg total) by mouth 3 (three) times daily as needed for cough. Patient not taking: Reported on 07/27/2017 02/21/15   Mackuen, Courteney Lyn, MD  cyclobenzaprine (FLEXERIL) 5 MG tablet Take 1 tablet (5 mg total) by mouth at bedtime as needed for muscle spasms. 07/27/17   Bruce Churilla, PA-C  guaiFENesin-codeine 100-10 MG/5ML syrup Take 5 mLs by mouth every 6 (six) hours as needed for cough. Patient not taking: Reported on 07/27/2017 02/21/15   Mackuen, Courteney Lyn, MD  ibuprofen  (ADVIL,MOTRIN) 800 MG tablet Take 1 tablet (800 mg total) by mouth 3 (three) times daily. Patient not taking: Reported on 07/27/2017 02/21/15   Mackuen, Courteney Lyn, MD  Polyethyl Glycol-Propyl Glycol (SYSTANE) 0.4-0.3 % SOLN Apply 1 drop to eye 4 (four) times daily as needed. Patient not taking: Reported on 06/21/2014 11/25/11   Domenick Gong, MD    Family History Family History  Problem Relation Age of Onset  . Lupus Mother   . Rheum arthritis Mother   . Multiple sclerosis Sister     Social History Social History   Tobacco Use  . Smoking status: Never Smoker  . Smokeless tobacco: Never Used  Substance Use Topics  . Alcohol use: No  . Drug use: No     Allergies   Aspirin   Review of Systems Review of Systems  Musculoskeletal: Positive for back pain.  Neurological: Negative for numbness.     Physical Exam Updated Vital Signs BP 121/78 (BP Location: Right Arm)   Pulse 88   Temp 98.3 F (36.8 C) (Oral)   Resp 16   Ht 5\' 7"  (1.702 m)   Wt 86.2 kg (190 lb)   LMP 07/20/2017   SpO2 100%   BMI 29.76 kg/m   Physical Exam  Constitutional: She is oriented to person, place, and time. She appears well-developed and well-nourished. No distress.  Patient sitting  up in bed in no apparent distress.  HENT:  Head: Normocephalic and atraumatic.  Eyes: Pupils are equal, round, and reactive to light. Conjunctivae and EOM are normal.  Neck: Normal range of motion.  No TTP of midline c-spine  Cardiovascular: Normal rate, regular rhythm and intact distal pulses.  Pulmonary/Chest: Effort normal and breath sounds normal. No respiratory distress. She has no wheezes.  Abdominal: Soft. She exhibits no distension. There is no tenderness.  Musculoskeletal: Normal range of motion.  Tenderness to palpation of bilateral trapezius muscles, worse on the right.  No increased pain over midline spine.  No tenderness to palpation of lower back.  Strength intact x4.  Sensation intact x4.   Radial and pedal pulses equal bilaterally.  No obvious deformity.  Patient is ambulatory.  No pain with bending or arching at the hips.  No pain with flexion of the arms, increased pain with extension.   Neurological: She is alert and oriented to person, place, and time.  Skin: Skin is warm. No rash noted.  Psychiatric: She has a normal mood and affect.  Nursing note and vitals reviewed.    ED Treatments / Results  Labs (all labs ordered are listed, but only abnormal results are displayed) Labs Reviewed - No data to display  EKG None  Radiology No results found.  Procedures Procedures (including critical care time)  Medications Ordered in ED Medications - No data to display   Initial Impression / Assessment and Plan / ED Course  I have reviewed the triage vital signs and the nursing notes.  Pertinent labs & imaging results that were available during my care of the patient were reviewed by me and considered in my medical decision making (see chart for details).     Patient presenting for evaluation of back pain.  Physical exam reassuring, no obvious neurologic deficits.  No red flags of back pain.  Tennis palpation of the musculature of the back.  Doubt spinal cord fracture, infection, compression, myelopathy.  Likely muscular cause of symptoms.  Discussed findings with patient.  Discussed treatment with NSAIDs and muscle relaxers. F/u with ortho as needed.  At this time, patient appears safe for discharge.  Return precautions given.  Patient states she understands and agrees to plan.   Final Clinical Impressions(s) / ED Diagnoses   Final diagnoses:  Musculoskeletal back pain    ED Discharge Orders        Ordered    cyclobenzaprine (FLEXERIL) 5 MG tablet  At bedtime PRN     07/27/17 1139       Tryston Gilliam, PA-C 07/27/17 1150    Samuel JesterMcManus, Kathleen, DO 07/28/17 1309

## 2017-07-27 NOTE — ED Triage Notes (Signed)
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 MAE, but has pain when she moves her arms back.

## 2017-07-27 NOTE — Discharge Instructions (Signed)
Take ibuprofen 3 times a day with meals.  Do not take other anti-inflammatories at the same time open (Advil, Motrin, naproxen, Aleve). You may supplement with Tylenol if you need further pain control. Use heating pads to help control your pain. Take flexeril as needed for muscle stiffness, soreness, or pain. Have caution, as this medication may make you tired or groggy. Do not driver or operate heavy machinery while taking this medication.  Follow up with the orthopedic doctor if your pain is not improving in 1 week.  Return to the ER if you develop high fevers, numbness, inability to move your head, or any new or concerning symptoms.

## 2017-07-27 NOTE — ED Notes (Signed)
Bed: [facility] Expected date:  Expected time:  Means of arrival:  Comments: NO BED 

## 2021-09-29 ENCOUNTER — Other Ambulatory Visit: Payer: Self-pay

## 2021-09-29 ENCOUNTER — Emergency Department (HOSPITAL_BASED_OUTPATIENT_CLINIC_OR_DEPARTMENT_OTHER)
Admission: EM | Admit: 2021-09-29 | Discharge: 2021-09-29 | Disposition: A | Discharge status: Home / Self Care | Payer: Self-pay | Attending: Emergency Medicine | Admitting: Emergency Medicine

## 2021-09-29 ENCOUNTER — Encounter (HOSPITAL_BASED_OUTPATIENT_CLINIC_OR_DEPARTMENT_OTHER): Payer: Self-pay

## 2021-09-29 DIAGNOSIS — Z111 Encounter for screening for respiratory tuberculosis: Secondary | ICD-10-CM | POA: Insufficient documentation

## 2021-09-29 DIAGNOSIS — Z5321 Procedure and treatment not carried out due to patient leaving prior to being seen by health care provider: Secondary | ICD-10-CM | POA: Insufficient documentation

## 2021-09-29 NOTE — ED Provider Triage Note (Signed)
Emergency Medicine Provider Triage Evaluation Note  Rebecca Levine , a 41 y.o. female  was evaluated in triage.  Patient presenting to have her PPD reading from 3 days ago.  This was done for her job in [employer].  She called to urgent care says that they would not read it because they did not do it.  Review of Systems  N/A  Physical Exam  There were no vitals taken for this visit. Gen:   Awake, no distress   Resp:  Normal effort  MSK:   Moves extremities without difficulty  Other:    Medical Decision Making  Medically screening exam initiated at 9:17 AM.  Appropriate orders placed.  Rebecca Levine was informed that the remainder of the evaluation will be completed by another provider, this initial triage assessment does not replace that evaluation, and the importance of remaining in the ED until their evaluation is complete.    Patient was notified that we do not do PPD readings in her department.  She may follow-up with the [facility] on [facility].  She may also go to the health department.  She was frustrated but understanding and ambulated out of the department prior to discharge.   Saddie Benders, PA-C 09/29/21 [phone]

## 2021-09-29 NOTE — ED Triage Notes (Signed)
Patient in stating that she received a TB skin test fot [place] [employer] and they told her she could get it read anywhere, so she came here.

## 2021-10-11 NOTE — ED Provider Notes (Signed)
  [facility] EMERGENCY DEPARTMENT Provider Note   CSN: [id number] Arrival date & time: 09/29/21  8250     History  Chief Complaint  Patient presents with   PPD Reading    Rebecca Levine is a 41 y.o. female.  HPI    The patient had come to the ER with PPD reading request.  Patient was informed that this service is not provided.  Medical screening exam was completed by APP.  Patient promptly left the ER.  Home Medications Prior to Admission medications   Medication Sig Start Date End Date Taking? Authorizing Provider  benzonatate (TESSALON PERLES) 100 MG capsule Take 1 capsule (100 mg total) by mouth 3 (three) times daily as needed for cough. Patient not taking: Reported on 07/27/2017 02/21/15   Mackuen, Courteney Lyn, MD  cyclobenzaprine (FLEXERIL) 5 MG tablet Take 1 tablet (5 mg total) by mouth at bedtime as needed for muscle spasms. 07/27/17   Caccavale, Sophia, PA-C  guaiFENesin-codeine 100-10 MG/5ML syrup Take 5 mLs by mouth every 6 (six) hours as needed for cough. Patient not taking: Reported on 07/27/2017 02/21/15   Mackuen, Courteney Lyn, MD  ibuprofen (ADVIL,MOTRIN) 800 MG tablet Take 1 tablet (800 mg total) by mouth 3 (three) times daily. Patient not taking: Reported on 07/27/2017 02/21/15   Mackuen, Courteney Lyn, MD  Polyethyl Glycol-Propyl Glycol (SYSTANE) 0.4-0.3 % SOLN Apply 1 drop to eye 4 (four) times daily as needed. Patient not taking: Reported on 06/21/2014 11/25/11   Domenick Gong, MD      Allergies    Aspirin    Review of Systems   Review of Systems  Physical Exam Updated Vital Signs BP (!) 136/97   Pulse 77   Temp 98.6 F (37 C) (Oral)   Resp 16   SpO2 99%  Physical Exam  ED Results / Procedures / Treatments   Labs (all labs ordered are listed, but only abnormal results are displayed) Labs Reviewed - No data to display  EKG None  Radiology No results found.  Procedures Procedures    Medications Ordered in ED Medications -  No data to display  ED Course/ Medical Decision Making/ A&P                           Medical Decision Making   Final Clinical Impression(s) / ED Diagnoses Final diagnoses:  PPD screening test    Rx / DC Orders ED Discharge Orders     None         Derwood Kaplan, MD 10/11/21 [phone]

## 2021-10-21 ENCOUNTER — Emergency Department (HOSPITAL_BASED_OUTPATIENT_CLINIC_OR_DEPARTMENT_OTHER): Payer: No Typology Code available for payment source

## 2021-10-21 ENCOUNTER — Encounter (HOSPITAL_BASED_OUTPATIENT_CLINIC_OR_DEPARTMENT_OTHER): Payer: Self-pay | Admitting: Emergency Medicine

## 2021-10-21 ENCOUNTER — Emergency Department (HOSPITAL_BASED_OUTPATIENT_CLINIC_OR_DEPARTMENT_OTHER)
Admission: EM | Admit: 2021-10-21 | Discharge: 2021-10-21 | Disposition: A | Discharge status: Home / Self Care | Payer: No Typology Code available for payment source | Attending: Emergency Medicine | Admitting: Emergency Medicine

## 2021-10-21 ENCOUNTER — Other Ambulatory Visit: Payer: Self-pay

## 2021-10-21 DIAGNOSIS — J45909 Unspecified asthma, uncomplicated: Secondary | ICD-10-CM | POA: Insufficient documentation

## 2021-10-21 DIAGNOSIS — U071 COVID-19: Secondary | ICD-10-CM | POA: Diagnosis not present

## 2021-10-21 DIAGNOSIS — R059 Cough, unspecified: Secondary | ICD-10-CM | POA: Diagnosis present

## 2021-10-21 LAB — SARS CORONAVIRUS 2 BY RT PCR: SARS Coronavirus 2 by RT PCR: POSITIVE — AB

## 2021-10-21 MED ORDER — ONDANSETRON HCL 4 MG PO TABS: 4.0000 mg | ORAL_TABLET | Freq: Four times a day (QID) | ORAL | 0 refills | Status: DC

## 2021-10-21 MED ORDER — ALBUTEROL SULFATE HFA 108 (90 BASE) MCG/ACT IN AERS
2.0000 | INHALATION_SPRAY | Freq: Once | RESPIRATORY_TRACT | Status: AC
  Administered 2021-10-21: 2 via RESPIRATORY_TRACT
  Filled 2021-10-21: qty 6.7

## 2021-10-21 MED ORDER — ACETAMINOPHEN 325 MG PO TABS: 650.0000 mg | ORAL_TABLET | Freq: Once | ORAL | Status: DC | PRN

## 2021-10-21 MED ORDER — ACETAMINOPHEN 160 MG/5ML PO SOLN
650.0000 mg | Freq: Once | ORAL | Status: AC
  Administered 2021-10-21: 650 mg via ORAL
  Filled 2021-10-21: qty 20.3

## 2021-10-21 MED ORDER — ONDANSETRON 4 MG PO TBDP
4.0000 mg | ORAL_TABLET | Freq: Once | ORAL | Status: AC
  Administered 2021-10-21: 4 mg via ORAL
  Filled 2021-10-21: qty 1

## 2021-10-21 MED ORDER — BENZONATATE 200 MG PO CAPS: 200.0000 mg | ORAL_CAPSULE | Freq: Three times a day (TID) | ORAL | 0 refills | Status: DC | PRN

## 2021-10-21 NOTE — ED Triage Notes (Signed)
Generalized body aches, congestion, headache, and productive cough since yesterday.

## 2022-06-02 ENCOUNTER — Other Ambulatory Visit: Payer: Self-pay

## 2022-06-02 ENCOUNTER — Encounter (HOSPITAL_BASED_OUTPATIENT_CLINIC_OR_DEPARTMENT_OTHER): Payer: Self-pay | Admitting: Emergency Medicine

## 2022-06-02 ENCOUNTER — Emergency Department (HOSPITAL_BASED_OUTPATIENT_CLINIC_OR_DEPARTMENT_OTHER)
Admission: EM | Admit: 2022-06-02 | Discharge: 2022-06-02 | Disposition: A | Discharge status: Home / Self Care | Payer: BC Managed Care – PPO | Attending: Emergency Medicine | Admitting: Emergency Medicine

## 2022-06-02 DIAGNOSIS — Z1152 Encounter for screening for COVID-19: Secondary | ICD-10-CM | POA: Insufficient documentation

## 2022-06-02 DIAGNOSIS — J101 Influenza due to other identified influenza virus with other respiratory manifestations: Secondary | ICD-10-CM | POA: Insufficient documentation

## 2022-06-02 DIAGNOSIS — R059 Cough, unspecified: Secondary | ICD-10-CM | POA: Diagnosis present

## 2022-06-02 LAB — RESP PANEL BY RT-PCR (RSV, FLU A&B, COVID)  RVPGX2
Influenza A by PCR: POSITIVE — AB
Influenza B by PCR: NEGATIVE
Resp Syncytial Virus by PCR: NEGATIVE
SARS Coronavirus 2 by RT PCR: NEGATIVE

## 2022-06-02 LAB — GROUP A STREP BY PCR: Group A Strep by PCR: NOT DETECTED

## 2022-06-02 MED ORDER — ONDANSETRON 4 MG PO TBDP: 4.0000 mg | ORAL_TABLET | Freq: Three times a day (TID) | ORAL | 0 refills | Status: AC | PRN

## 2022-06-02 MED ORDER — OSELTAMIVIR PHOSPHATE 75 MG PO CAPS: 75.0000 mg | ORAL_CAPSULE | Freq: Two times a day (BID) | ORAL | 0 refills | Status: DC

## 2022-06-02 MED ORDER — ONDANSETRON 4 MG PO TBDP: 4.0000 mg | ORAL_TABLET | Freq: Three times a day (TID) | ORAL | 0 refills | Status: DC | PRN

## 2022-06-02 NOTE — ED Triage Notes (Signed)
Headache, chills , body aches , fever , sore throat x 1 day .

## 2022-06-02 NOTE — ED Triage Notes (Signed)
Headache, chills , fever , body aches , sore throat x 1 day .

## 2022-06-02 NOTE — ED Provider Notes (Signed)
[place] [facility] Provider Note   CSN: [id number] Arrival date & time: 06/02/22  1757     History  Chief Complaint  Patient presents with   URI    Rebecca Levine is a 42 y.o. female.  42 year old female presents today for evaluation of URI symptoms.  She states she has been staying with her mom who is immunocompromised and came down with the flu to help out.  She developed the flu symptoms yesterday.  Denies other complaints.  Does report some nausea but no vomiting.  Hydrating well.  The history is provided by the patient. No language interpreter was used.       Home Medications Prior to Admission medications   Medication Sig Start Date End Date Taking? Authorizing Provider  benzonatate (TESSALON) 200 MG capsule Take 1 capsule (200 mg total) by mouth 3 (three) times daily as needed for cough. 10/21/21   Jacqlyn Larsen, PA-C  cyclobenzaprine (FLEXERIL) 5 MG tablet Take 1 tablet (5 mg total) by mouth at bedtime as needed for muscle spasms. 07/27/17   Caccavale, Sophia, PA-C  guaiFENesin-codeine 100-10 MG/5ML syrup Take 5 mLs by mouth every 6 (six) hours as needed for cough. Patient not taking: Reported on 07/27/2017 02/21/15   Mackuen, Courteney Lyn, MD  ibuprofen (ADVIL,MOTRIN) 800 MG tablet Take 1 tablet (800 mg total) by mouth 3 (three) times daily. Patient not taking: Reported on 07/27/2017 02/21/15   Mackuen, Courteney Lyn, MD  ondansetron (ZOFRAN) 4 MG tablet Take 1 tablet (4 mg total) by mouth every 6 (six) hours. 10/21/21   Jacqlyn Larsen, PA-C  ondansetron (ZOFRAN-ODT) 4 MG disintegrating tablet Take 1 tablet (4 mg total) by mouth every 8 (eight) hours as needed. 06/02/22   Evlyn Courier, PA-C  oseltamivir (TAMIFLU) 75 MG capsule Take 1 capsule (75 mg total) by mouth every 12 (twelve) hours. 06/02/22   Evlyn Courier, PA-C  Polyethyl Glycol-Propyl Glycol (SYSTANE) 0.4-0.3 % SOLN Apply 1 drop to eye 4 (four) times daily as needed. Patient not  taking: Reported on 06/21/2014 11/25/11   Melynda Ripple, MD      Allergies    Aspirin    Review of Systems   Review of Systems  Constitutional:  Positive for fever.  Respiratory:  Positive for cough. Negative for shortness of breath.   Gastrointestinal:  Positive for nausea. Negative for abdominal pain and vomiting.  All other systems reviewed and are negative.   Physical Exam Updated Vital Signs BP 122/73 (BP Location: Left Arm)   Pulse 98   Temp (!) 100.5 F (38.1 C) (Oral)   Resp 20   Wt 86.2 kg   LMP 07/20/2017   SpO2 100%   BMI 29.76 kg/m  Physical Exam Vitals and nursing note reviewed.  Constitutional:      General: She is not in acute distress.    Appearance: Normal appearance. She is not ill-appearing.  HENT:     Head: Normocephalic and atraumatic.     Nose: Nose normal.  Eyes:     General: No scleral icterus.    Extraocular Movements: Extraocular movements intact.     Conjunctiva/sclera: Conjunctivae normal.  Cardiovascular:     Rate and Rhythm: Normal rate and regular rhythm.     Pulses: Normal pulses.  Pulmonary:     Effort: Pulmonary effort is normal. No respiratory distress.     Breath sounds: Normal breath sounds. No wheezing or rales.  Abdominal:     General: There  is no distension.     Palpations: Abdomen is soft.     Tenderness: There is no abdominal tenderness. There is no guarding.  Musculoskeletal:        General: Normal range of motion.     Cervical back: Normal range of motion.  Skin:    General: Skin is warm and dry.  Neurological:     General: No focal deficit present.     Mental Status: She is alert. Mental status is at baseline.     ED Results / Procedures / Treatments   Labs (all labs ordered are listed, but only abnormal results are displayed) Labs Reviewed  RESP PANEL BY RT-PCR (RSV, FLU A&B, COVID)  RVPGX2 - Abnormal; Notable for the following components:      Result Value   Influenza A by PCR POSITIVE (*)    All other  components within normal limits  GROUP A STREP BY PCR    EKG None  Radiology No results found.  Procedures Procedures    Medications Ordered in ED Medications - No data to display  ED Course/ Medical Decision Making/ A&P                             Medical Decision Making Risk Prescription drug management.   42 year old female presents today for evaluation of flulike symptoms.  Respiratory panel positive for influenza A.  Symptomatic management discussed.  Zofran provided.  Tamiflu prescribed.  Return precaution discussed.  Patient voices understanding and is in agreement with plan.  Overall well-appearing.  Lung sounds are clear.  Vital signs reassuring.  Patient appropriate for discharge.   Final Clinical Impression(s) / ED Diagnoses Final diagnoses:  Influenza A    Rx / DC Orders ED Discharge Orders          Ordered    oseltamivir (TAMIFLU) 75 MG capsule  Every 12 hours,   Status:  Discontinued        06/02/22 1914    ondansetron (ZOFRAN-ODT) 4 MG disintegrating tablet  Every 8 hours PRN,   Status:  Discontinued        06/02/22 1914    ondansetron (ZOFRAN-ODT) 4 MG disintegrating tablet  Every 8 hours PRN        06/02/22 1920    oseltamivir (TAMIFLU) 75 MG capsule  Every 12 hours        06/02/22 1920              Evlyn Courier, PA-C 06/02/22 1931    Lajean Saver, MD 06/02/22 2309

## 2022-06-02 NOTE — Discharge Instructions (Addendum)
Your test is positive for flu.  Have sent Tamiflu, Zofran into the pharmacy for you.  Drink plenty of fluids.  Take Tylenol Motrin as ED2 for fever control and bodyaches.  Return for concerning symptoms otherwise follow-up with your primary care provider.  If you do not have a primary care provider.  Call the clinic above to establish care.  Drink plenty of fluids.

## 2022-06-02 NOTE — ED Notes (Signed)
Discharge paperwork reviewed entirely with patient, including Rx's and follow up care. Pain was under control. Pt verbalized understanding as well as all parties involved. No questions or concerns voiced at the time of discharge. No acute distress noted.   Pt ambulated out to PVA without incident or assistance.

## 2022-08-23 ENCOUNTER — Other Ambulatory Visit: Payer: Self-pay

## 2022-08-23 ENCOUNTER — Emergency Department (HOSPITAL_BASED_OUTPATIENT_CLINIC_OR_DEPARTMENT_OTHER)
Admission: EM | Admit: 2022-08-23 | Discharge: 2022-08-23 | Disposition: A | Discharge status: Home / Self Care | Payer: BC Managed Care – PPO | Attending: Emergency Medicine | Admitting: Emergency Medicine

## 2022-08-23 ENCOUNTER — Encounter (HOSPITAL_BASED_OUTPATIENT_CLINIC_OR_DEPARTMENT_OTHER): Payer: Self-pay

## 2022-08-23 DIAGNOSIS — J029 Acute pharyngitis, unspecified: Secondary | ICD-10-CM | POA: Diagnosis present

## 2022-08-23 DIAGNOSIS — Z1152 Encounter for screening for COVID-19: Secondary | ICD-10-CM | POA: Insufficient documentation

## 2022-08-23 DIAGNOSIS — J020 Streptococcal pharyngitis: Secondary | ICD-10-CM | POA: Insufficient documentation

## 2022-08-23 DIAGNOSIS — J45909 Unspecified asthma, uncomplicated: Secondary | ICD-10-CM | POA: Insufficient documentation

## 2022-08-23 LAB — SARS CORONAVIRUS 2 BY RT PCR: SARS Coronavirus 2 by RT PCR: NEGATIVE

## 2022-08-23 LAB — GROUP A STREP BY PCR: Group A Strep by PCR: DETECTED — AB

## 2022-08-23 MED ORDER — DEXAMETHASONE 4 MG PO TABS
10.0000 mg | ORAL_TABLET | Freq: Once | ORAL | Status: AC
  Administered 2022-08-23: 10 mg via ORAL
  Filled 2022-08-23: qty 3

## 2022-08-23 MED ORDER — AMOXICILLIN 500 MG PO CAPS: 1000.0000 mg | ORAL_CAPSULE | Freq: Two times a day (BID) | ORAL | 0 refills | Status: DC

## 2022-08-23 MED ORDER — AMOXICILLIN 500 MG PO CAPS
1000.0000 mg | ORAL_CAPSULE | Freq: Once | ORAL | Status: AC
  Administered 2022-08-23: 1000 mg via ORAL
  Filled 2022-08-23: qty 2

## 2022-08-23 NOTE — ED Provider Notes (Signed)
[place] [facility] AT [facility] Provider Note   CSN: [id number] Arrival date & time: 08/23/22  0257     History  Chief Complaint  Patient presents with   Generalized Body Aches    Rebecca Levine is a 42 y.o. female.  The history is provided by the patient.  She has history of asthma and comes in complaining of sore throat which started last night.  She notes that it hurts on the left side of her throat when she swallows.  She has also developed some generalized bodyaches.  She denies fever or chills.  She denies any rhinorrhea or cough.  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   Home Medications Prior to Admission medications   Medication Sig Start Date End Date Taking? Authorizing Provider  amoxicillin (AMOXIL) 500 MG capsule Take 2 capsules (1,000 mg total) by mouth 2 (two) times daily. 08/23/22  Yes Dione Booze, MD  cyclobenzaprine (FLEXERIL) 5 MG tablet Take 1 tablet (5 mg total) by mouth at bedtime as needed for muscle spasms. 07/27/17   Caccavale, Sophia, PA-C  ondansetron (ZOFRAN) 4 MG tablet Take 1 tablet (4 mg total) by mouth every 6 (six) hours. 10/21/21   Dartha Lodge, PA-C  ondansetron (ZOFRAN-ODT) 4 MG disintegrating tablet Take 1 tablet (4 mg total) by mouth every 8 (eight) hours as needed. 06/02/22   Marita Kansas, PA-C      Allergies    Aspirin    Review of Systems   Review of Systems  All other systems reviewed and are negative.   Physical Exam Updated Vital Signs BP 130/80   Pulse 89   Temp 98.7 F (37.1 C) (Oral)   Resp 14   Ht 5\' 7"  (1.702 m)   Wt 83.9 kg   LMP 07/20/2017   SpO2 100%   BMI 28.98 kg/m  Physical Exam Vitals and nursing note reviewed.   42 year old female, resting comfortably and in no acute distress. Vital signs are normal. Oxygen saturation is 100%, which is normal. Head is normocephalic and atraumatic. PERRLA, EOMI. Oropharynx shows mild bilateral tonsillar erythema with moderate  exudate present on the left tonsil.  Phonation is normal.  There is no pooling of secretions. Neck is nontender and supple without adenopathy. Lungs are clear without rales, wheezes, or rhonchi. Chest is nontender. Heart has regular rate and rhythm without murmur. Abdomen is soft, flat, nontender. Skin is warm and dry without rash. Neurologic: Awake and alert, moves all extremities equally.  ED Results / Procedures / Treatments   Labs (all labs ordered are listed, but only abnormal results are displayed) Labs Reviewed  GROUP A STREP BY PCR - Abnormal; Notable for the following components:      Result Value   Group A Strep by PCR DETECTED (*)    All other components within normal limits  SARS CORONAVIRUS 2 BY RT PCR   Procedures Procedures    Medications Ordered in ED Medications  amoxicillin (AMOXIL) capsule 1,000 mg (has no administration in time range)  dexamethasone (DECADRON) tablet 10 mg (has no administration in time range)    ED Course/ Medical Decision Making/ A&P                             Medical Decision Making Risk Prescription drug management.   Sore throat and bodyaches.  Differential diagnosis is streptococcal pharyngitis versus viral illness.  I  have reviewed her laboratory test, and strep PCR is positive, she has streptococcal pharyngitis.  PCR for COVID-19 is negative.  I have offered patient the option of parenteral versus oral antibiotics, and she has elected to go with oral antibiotics.  I have ordered an initial dose of amoxicillin and a single dose of dexamethasone and I am discharging her with a prescription for amoxicillin for 10 days.  Importance of completing the course of antibiotics was stressed.  Because she is contagious until she has been on antibiotics for 24-48 hours, I have ordered a note to be off work for 48 hours.  Final Clinical Impression(s) / ED Diagnoses Final diagnoses:  Streptococcal pharyngitis    Rx / DC Orders ED Discharge  Orders          Ordered    amoxicillin (AMOXIL) 500 MG capsule  2 times daily        08/23/22 5284              Dione Booze, MD 08/23/22 [phone]

## 2022-08-23 NOTE — ED Triage Notes (Signed)
Pt was at work and felt fine and she slowly started to feel body aches. 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 Pt also endorses LT sided throat aching. Pt with hx of asthma so feels pressure in chest every time she works. Does not have an inhaler for her asthma. Denies fevers.

## 2022-08-23 NOTE — Discharge Instructions (Addendum)
Drink plenty of fluids.  Use lozenges, throat sprays as needed.  Take acetaminophen and/or ibuprofen as needed for fever or pain.

## 2022-08-23 NOTE — ED Notes (Signed)
ED Provider at bedside. 

## 2022-10-01 ENCOUNTER — Encounter (HOSPITAL_BASED_OUTPATIENT_CLINIC_OR_DEPARTMENT_OTHER): Payer: Self-pay | Admitting: Emergency Medicine

## 2022-10-01 ENCOUNTER — Emergency Department (HOSPITAL_BASED_OUTPATIENT_CLINIC_OR_DEPARTMENT_OTHER): Payer: BC Managed Care – PPO

## 2022-10-01 ENCOUNTER — Emergency Department (HOSPITAL_BASED_OUTPATIENT_CLINIC_OR_DEPARTMENT_OTHER)
Admission: EM | Admit: 2022-10-01 | Discharge: 2022-10-01 | Disposition: A | Discharge status: Home / Self Care | Payer: BC Managed Care – PPO | Attending: Emergency Medicine | Admitting: Emergency Medicine

## 2022-10-01 DIAGNOSIS — J069 Acute upper respiratory infection, unspecified: Secondary | ICD-10-CM

## 2022-10-01 DIAGNOSIS — J45909 Unspecified asthma, uncomplicated: Secondary | ICD-10-CM | POA: Insufficient documentation

## 2022-10-01 DIAGNOSIS — J040 Acute laryngitis: Secondary | ICD-10-CM

## 2022-10-01 DIAGNOSIS — R0602 Shortness of breath: Secondary | ICD-10-CM | POA: Diagnosis present

## 2022-10-01 MED ORDER — IPRATROPIUM-ALBUTEROL 0.5-2.5 (3) MG/3ML IN SOLN
RESPIRATORY_TRACT | Status: AC
  Filled 2022-10-01: qty 3

## 2022-10-01 MED ORDER — ALBUTEROL SULFATE HFA 108 (90 BASE) MCG/ACT IN AERS
2.0000 | INHALATION_SPRAY | RESPIRATORY_TRACT | Status: DC | PRN
  Filled 2022-10-01: qty 6.7

## 2022-10-01 MED ORDER — PREDNISONE 20 MG PO TABS: 40.0000 mg | ORAL_TABLET | Freq: Every day | ORAL | 0 refills | Status: AC

## 2022-10-01 MED ORDER — PREDNISONE 50 MG PO TABS
60.0000 mg | ORAL_TABLET | Freq: Once | ORAL | Status: AC
  Administered 2022-10-01: 60 mg via ORAL
  Filled 2022-10-01: qty 1

## 2022-10-01 MED ORDER — IPRATROPIUM-ALBUTEROL 0.5-2.5 (3) MG/3ML IN SOLN
3.0000 mL | Freq: Once | RESPIRATORY_TRACT | Status: AC
  Administered 2022-10-01: 3 mL via RESPIRATORY_TRACT

## 2022-10-01 NOTE — Discharge Instructions (Signed)
You have been seen today for your complaint of cough, congestion, runny nose. Your imaging was reassuring. Your discharge medications include prednisone.  This is a steroid.  Take it as prescribed and for the entire duration of the prescription. Home care instructions are as follows:  Use Claritin and Flonase daily Follow up with: Primary care provider in 1 week for reevaluation Please seek immediate medical care if you develop any of the following symptoms: You have shortness of breath that gets worse. You have severe or persistent: Headache. Ear pain. Sinus pain. Chest pain. You have chronic lung disease along with any of the following: Making high-pitched whistling sounds when you breathe, most often when you breathe out (wheezing). Prolonged cough (more than 14 days). Coughing up blood. A change in your usual mucus. You have a stiff neck. You have changes in your: Vision. Hearing. Thinking. Mood. At this time there does not appear to be the presence of an emergent medical condition, however there is always the potential for conditions to change. Please read and follow the below instructions.  Do not take your medicine if  develop an itchy rash, swelling in your mouth or lips, or difficulty breathing; call 911 and seek immediate emergency medical attention if this occurs.  You may review your lab tests and imaging results in their entirety on your MyChart account.  Please discuss all results of fully with your primary care provider and other specialist at your follow-up visit.  Note: Portions of this text may have been transcribed using voice recognition software. Every effort was made to ensure accuracy; however, inadvertent computerized transcription errors may still be present.

## 2022-10-01 NOTE — ED Notes (Signed)
Patient transported to X-ray 

## 2022-10-01 NOTE — ED Notes (Signed)

## 2022-10-01 NOTE — ED Provider Notes (Signed)
[place] [facility] AT [facility] Provider Note   CSN: [id number] Arrival date & time: 10/01/22  1224     History  Chief Complaint  Patient presents with   Shortness of Breath    Rebecca Levine is a 42 y.o. female.  With history of asthma who presents to the ED for evaluation of cough, shortness of breath, wheezing.  She states her symptoms began on Sunday.  On Monday she lost her voice.  She has had laryngitis in the past.  She states she has not had an albuterol inhaler at home lately because she has not needed one, however typically has flareups of her asthma at season changes.  Her cough was initially nonproductive but has been productive today with yellow sputum.  Her shortness of breath is described as mild.  She has some anterior chest wall pain due to her coughing.  She denies any hemoptysis.  No history of DVT or PE.  No unilateral leg swelling.  She does not take any estrogen containing products.  No recent cancer treatments, travel or surgery.   Shortness of Breath Associated symptoms: cough and wheezing        Home Medications Prior to Admission medications   Medication Sig Start Date End Date Taking? Authorizing Provider  predniSONE (DELTASONE) 20 MG tablet Take 2 tablets (40 mg total) by mouth daily with breakfast for 4 days. 10/01/22 10/05/22 Yes Sayed Apostol, Edsel Petrin, PA-C  amoxicillin (AMOXIL) 500 MG capsule Take 2 capsules (1,000 mg total) by mouth 2 (two) times daily. 08/23/22   Dione Booze, MD  cyclobenzaprine (FLEXERIL) 5 MG tablet Take 1 tablet (5 mg total) by mouth at bedtime as needed for muscle spasms. 07/27/17   Caccavale, Sophia, PA-C  ondansetron (ZOFRAN) 4 MG tablet Take 1 tablet (4 mg total) by mouth every 6 (six) hours. 10/21/21   Dartha Lodge, PA-C  ondansetron (ZOFRAN-ODT) 4 MG disintegrating tablet Take 1 tablet (4 mg total) by mouth every 8 (eight) hours as needed. 06/02/22   Marita Kansas, PA-C      Allergies    Aspirin     Review of Systems   Review of Systems  Respiratory:  Positive for cough, shortness of breath and wheezing.   All other systems reviewed and are negative.   Physical Exam Updated Vital Signs BP 131/89 (BP Location: Right Arm)   Pulse 88   Temp 99.4 F (37.4 C) (Oral)   Resp 16   Ht 5\' 7"  (1.702 m)   Wt 83.9 kg   LMP 07/20/2017   SpO2 100%   BMI 28.98 kg/m  Physical Exam Vitals and nursing note reviewed.  Constitutional:      General: She is not in acute distress.    Appearance: She is well-developed.     Comments: Resting comfortably in bed.  Raspy voice consistent with laryngitis  HENT:     Head: Normocephalic and atraumatic.  Eyes:     Conjunctiva/sclera: Conjunctivae normal.  Cardiovascular:     Rate and Rhythm: Normal rate and regular rhythm.     Heart sounds: No murmur heard. Pulmonary:     Effort: Pulmonary effort is normal. No respiratory distress.     Breath sounds: Normal breath sounds. No decreased breath sounds, wheezing, rhonchi or rales.     Comments: No wheezing post DuoNeb Abdominal:     Palpations: Abdomen is soft.     Tenderness: There is no abdominal tenderness.  Musculoskeletal:  General: No swelling.     Cervical back: Neck supple.     Right lower leg: No edema.     Left lower leg: No edema.  Skin:    General: Skin is warm and dry.     Capillary Refill: Capillary refill takes less than 2 seconds.  Neurological:     Mental Status: She is alert.  Psychiatric:        Mood and Affect: Mood normal.     ED Results / Procedures / Treatments   Labs (all labs ordered are listed, but only abnormal results are displayed) Labs Reviewed - No data to display  EKG None  Radiology DG Chest 2 View  Result Date: 10/01/2022 CLINICAL DATA:  Several day history of cough and aphonia EXAM: CHEST - 2 VIEW COMPARISON:  Chest radiograph dated 10/21/2021 FINDINGS: Normal lung volumes. No focal consolidations. No pleural effusion or pneumothorax. The  heart size and mediastinal contours are within normal limits. No acute osseous abnormality. IMPRESSION: No active cardiopulmonary disease. Electronically Signed   By: Agustin Cree M.D.   On: 10/01/2022 13:33    Procedures Procedures    Medications Ordered in ED Medications  ipratropium-albuterol (DUONEB) 0.5-2.5 (3) MG/3ML nebulizer solution (  Not Given 10/01/22 1249)  albuterol (VENTOLIN HFA) 108 (90 Base) MCG/ACT inhaler 2 puff (2 puffs Inhalation Not Given 10/01/22 1253)  ipratropium-albuterol (DUONEB) 0.5-2.5 (3) MG/3ML nebulizer solution 3 mL (3 mLs Nebulization Given 10/01/22 1246)  predniSONE (DELTASONE) tablet 60 mg (60 mg Oral Given 10/01/22 1337)    ED Course/ Medical Decision Making/ A&P                             Medical Decision Making Amount and/or Complexity of Data Reviewed Radiology: ordered.  Risk Prescription drug management.  This patient presents to the ED for concern of cough, this involves an extensive number of treatment options, and is a complaint that carries with it a high risk of complications and morbidity. Differential diagnosis for emergent cause of cough includes but is not limited to upper respiratory infection, lower respiratory infection, allergies, asthma, irritants, foreign body, medications such as ACE inhibitors, reflux, asthma, CHF, lung cancer, interstitial lung disease, psychiatric causes, postnasal drip and postinfectious bronchospasm.   Co morbidities that complicate the patient evaluation  asthma  My initial workup includes duoneb, albuterol inhaler, prednisone, x ray  Additional history obtained from: Nursing notes from this visit.  I ordered imaging studies including CXR I independently visualized and interpreted imaging which showed normal I agree with the radiologist interpretation  Afebrile, slightly hypertensive but otherwise hemodynamically stable.  42 year old female presenting to the ED for evaluation of a cough and loss of her  voice.  Symptoms began on Sunday.  She also has congestion and rhinorrhea.  She appears very well on physical exam.  No adventitious breath sounds.  Does have a history of asthma.  She was given a DuoNeb treatment and an albuterol inhaler in the ED.  She will be sent prescription for a steroid burst given her laryngitis and mild wheezing.  Cough is likely secondary to postnasal drip.  She was encouraged to use Claritin and Flonase.  She is PERC negative and I have low suspicion for PE as the cause of her symptoms. she was given return precautions.  Stable at discharge.  At this time there does not appear to be any evidence of an acute emergency medical condition and the patient  appears stable for discharge with appropriate outpatient follow up. Diagnosis was discussed with patient who verbalizes understanding of care plan and is agreeable to discharge. I have discussed return precautions with patient who verbalizes understanding. Patient encouraged to follow-up with their PCP within 1 week. All questions answered.  Note: Portions of this report may have been transcribed using voice recognition software. Every effort was made to ensure accuracy; however, inadvertent computerized transcription errors may still be present.        Final Clinical Impression(s) / ED Diagnoses Final diagnoses:  Viral URI with cough  Laryngitis    Rx / DC Orders ED Discharge Orders          Ordered    predniSONE (DELTASONE) 20 MG tablet  Daily with breakfast        10/01/22 1352              Michelle Piper, PA-C 10/01/22 1354    Melene Plan, DO 10/01/22 1358

## 2022-10-01 NOTE — ED Triage Notes (Signed)
Pt reports SHOB, wheezing x 3d; hasn't had MDI for awhile d/t asthma was under control

## 2022-10-07 ENCOUNTER — Encounter (HOSPITAL_BASED_OUTPATIENT_CLINIC_OR_DEPARTMENT_OTHER): Payer: Self-pay

## 2022-10-07 ENCOUNTER — Emergency Department (HOSPITAL_BASED_OUTPATIENT_CLINIC_OR_DEPARTMENT_OTHER)
Admission: EM | Admit: 2022-10-07 | Discharge: 2022-10-07 | Disposition: A | Discharge status: Home / Self Care | Payer: BC Managed Care – PPO | Attending: Emergency Medicine | Admitting: Emergency Medicine

## 2022-10-07 ENCOUNTER — Other Ambulatory Visit: Payer: Self-pay

## 2022-10-07 DIAGNOSIS — J45909 Unspecified asthma, uncomplicated: Secondary | ICD-10-CM | POA: Insufficient documentation

## 2022-10-07 DIAGNOSIS — H9202 Otalgia, left ear: Secondary | ICD-10-CM | POA: Insufficient documentation

## 2022-10-07 DIAGNOSIS — R0981 Nasal congestion: Secondary | ICD-10-CM | POA: Diagnosis not present

## 2022-10-07 MED ORDER — AMOXICILLIN 500 MG PO CAPS
1000.0000 mg | ORAL_CAPSULE | Freq: Two times a day (BID) | ORAL | 0 refills | Status: AC
Start: 2022-10-07 — End: 2022-10-12

## 2022-10-07 NOTE — ED Provider Notes (Signed)
[place] [facility] Provider Note   CSN: [id number] Arrival date & time: 10/07/22  1135     History  Chief Complaint  Patient presents with   [facility] Rebecca Levine is a 42 y.o. female.  With a history of asthma who presents to the ED for evaluation of left-sided otalgia.  She states she has had an upper respiratory infection for the past 8 days.  She was seen here 6 days ago and reports some improvement in her symptoms since that time.  She has been using Claritin-D, Flonase and NyQuil for her symptoms.  She states she has been unable to get rid of the muffled hearing in her left ear.  When she blows her nose she reports pain in the left ear as well.  States it feels like she is listening underwater.  She states that she started working at a very old [employer] a few months ago and has felt sick ever since she started.  She denies any cough or shortness of breath, fevers or chills, ear drainage, recent water exposure including swimming in any lakes, oceans or pools.  Denies any right-sided ear pain or muffled hearing.   Otalgia      Home Medications Prior to Admission medications   Medication Sig Start Date End Date Taking? Authorizing Provider  amoxicillin (AMOXIL) 500 MG capsule Take 2 capsules (1,000 mg total) by mouth 2 (two) times daily for 5 days. 10/07/22 10/12/22 Yes Layal Javid, Edsel Petrin, PA-C  cyclobenzaprine (FLEXERIL) 5 MG tablet Take 1 tablet (5 mg total) by mouth at bedtime as needed for muscle spasms. 07/27/17   Caccavale, Sophia, PA-C  ondansetron (ZOFRAN) 4 MG tablet Take 1 tablet (4 mg total) by mouth every 6 (six) hours. 10/21/21   Dartha Lodge, PA-C  ondansetron (ZOFRAN-ODT) 4 MG disintegrating tablet Take 1 tablet (4 mg total) by mouth every 8 (eight) hours as needed. 06/02/22   Marita Kansas, PA-C      Allergies    Aspirin    Review of Systems   Review of Systems  HENT:  Positive for ear pain.   All other systems reviewed and  are negative.   Physical Exam Updated Vital Signs BP (!) 123/91 (BP Location: Right Arm)   Pulse 86   Temp 98.6 F (37 C) (Oral)   Resp 18   Ht 5\' 7"  (1.702 m)   Wt 83.9 kg   LMP 07/20/2017   SpO2 100%   BMI 28.97 kg/m  Physical Exam Vitals and nursing note reviewed.  Constitutional:      General: She is not in acute distress.    Appearance: Normal appearance. She is normal weight. She is not ill-appearing.     Comments: Resting comfortably in chair  HENT:     Head: Normocephalic and atraumatic.     Ears:     Comments: Bilateral serous effusions.  No TM injection or erythema.  No tragal tenderness.  No mastoid tenderness or erythema.  No abnormalities of the auditory canal    Nose: Congestion present.     Mouth/Throat:     Mouth: Mucous membranes are moist.     Pharynx: Oropharynx is clear.  Eyes:     Extraocular Movements: Extraocular movements intact.     Pupils: Pupils are equal, round, and reactive to light.  Pulmonary:     Effort: Pulmonary effort is normal. No respiratory distress.  Abdominal:     General: Abdomen is flat.  Musculoskeletal:        General: Normal range of motion.     Cervical back: Neck supple.  Skin:    General: Skin is warm and dry.  Neurological:     General: No focal deficit present.     Mental Status: She is alert and oriented to person, place, and time.  Psychiatric:        Mood and Affect: Mood normal.        Behavior: Behavior normal.     ED Results / Procedures / Treatments   Labs (all labs ordered are listed, but only abnormal results are displayed) Labs Reviewed - No data to display  EKG None  Radiology No results found.  Procedures Procedures    Medications Ordered in ED Medications - No data to display  ED Course/ Medical Decision Making/ A&P                             Medical Decision Making This patient presents to the ED for concern of muffled hearing, left-sided otalgia, congestion, rhinorrhea, this  involves an extensive number of treatment options, and is a complaint that carries with it a high risk of complications and morbidity.  The differential diagnosis includes eustachian tube dysfunction, otitis media, otitis externa, mastoiditis  Co morbidities that complicate the patient evaluation  asthma  Additional history obtained from: Nursing notes from this visit. Previous records within EMR system ED visits on 10/01/2022, 08/23/2022, 06/02/2022  Afebrile, hemodynamically stable.  42 year old female presenting to the ED for evaluation of left-sided otalgia and muffled hearing.  She has had an upper respiratory infection for the past 8 days.  She states her symptoms have improved since she initiated Flonase, Claritin-D and finished her course of prednisone.  Physical exam is overall reassuring.  She has bilateral serous effusions.  No evidence of infectious etiology.  Likely has some eustachian tube dysfunction.  She was encouraged to continue using her over-the-counter therapies and to chew gum throughout the day.  She was also encouraged to follow-up with her PCP within the next 5 days for reevaluation of her symptoms due to recurrent infections after starting her new job. She was sent a prescription for amoxicillin for otitis media and encouraged to fill this only if her symptoms worsen over the next couple days. She was given return precautions. Stable at discharge.  At this time there does not appear to be any evidence of an acute emergency medical condition and the patient appears stable for discharge with appropriate outpatient follow up. Diagnosis was discussed with patient who verbalizes understanding of care plan and is agreeable to discharge. I have discussed return precautions with patient who verbalizes understanding. Patient encouraged to follow-up with their PCP within 5 days. All questions answered.  Note: Portions of this report may have been transcribed using voice recognition  software. Every effort was made to ensure accuracy; however, inadvertent computerized transcription errors may still be present.        Final Clinical Impression(s) / ED Diagnoses Final diagnoses:  Left ear pain    Rx / DC Orders ED Discharge Orders          Ordered    amoxicillin (AMOXIL) 500 MG capsule  2 times daily        10/07/22 1219              Mora Bellman 10/07/22 1222    Long, Arlyss Repress, MD 10/08/22  1220  

## 2022-10-07 NOTE — ED Triage Notes (Signed)
C/o left ear pain x few days with trouble hearing. Was here on 6/5 with congestion, given steroids.

## 2022-10-07 NOTE — Discharge Instructions (Signed)
You have been seen today for your complaint of ear pain and muffled hearing. Your discharge medications include Amoxicillin. This is an antibiotic. You should only take it as prescribed if your symptoms worsen over the next 2 days. You should take it for the entire duration of the prescription. This may cause an upset stomach. This is normal. You may take this with food. You may also eat yogurt to prevent diarrhea. . Follow up with: your PCP within the next 5 days for evaluation of recurrent infections Please seek immediate medical care if you develop any of the following symptoms: You have a sudden, severe increase in any of your symptoms. At this time there does not appear to be the presence of an emergent medical condition, however there is always the potential for conditions to change. Please read and follow the below instructions.  Do not take your medicine if  develop an itchy rash, swelling in your mouth or lips, or difficulty breathing; call 911 and seek immediate emergency medical attention if this occurs.  You may review your lab tests and imaging results in their entirety on your MyChart account.  Please discuss all results of fully with your primary care provider and other specialist at your follow-up visit.  Note: Portions of this text may have been transcribed using voice recognition software. Every effort was made to ensure accuracy; however, inadvertent computerized transcription errors may still be present.

## 2022-11-01 ENCOUNTER — Other Ambulatory Visit: Payer: Self-pay

## 2022-11-01 ENCOUNTER — Encounter (HOSPITAL_BASED_OUTPATIENT_CLINIC_OR_DEPARTMENT_OTHER): Payer: Self-pay | Admitting: Emergency Medicine

## 2022-11-01 ENCOUNTER — Emergency Department (HOSPITAL_BASED_OUTPATIENT_CLINIC_OR_DEPARTMENT_OTHER): Admission: EM | Admit: 2022-11-01 | Payer: BC Managed Care – PPO | Source: Home / Self Care

## 2022-11-01 ENCOUNTER — Emergency Department (HOSPITAL_BASED_OUTPATIENT_CLINIC_OR_DEPARTMENT_OTHER)
Admission: EM | Admit: 2022-11-01 | Discharge: 2022-11-01 | Disposition: A | Discharge status: Home / Self Care | Payer: BC Managed Care – PPO | Attending: Emergency Medicine | Admitting: Emergency Medicine

## 2022-11-01 DIAGNOSIS — R519 Headache, unspecified: Secondary | ICD-10-CM

## 2022-11-01 MED ORDER — ACETAMINOPHEN 500 MG PO TABS: 1000.0000 mg | ORAL_TABLET | Freq: Four times a day (QID) | ORAL | 0 refills | Status: AC | PRN

## 2022-11-01 MED ORDER — KETOROLAC TROMETHAMINE 15 MG/ML IJ SOLN
15.0000 mg | Freq: Once | INTRAMUSCULAR | Status: AC
  Administered 2022-11-01: 15 mg via INTRAMUSCULAR
  Filled 2022-11-01: qty 1

## 2022-11-01 NOTE — ED Triage Notes (Signed)
   Pt c/o HA x a few days; Tylenol helps, but HA always comes back; no visual disturbances

## 2022-11-01 NOTE — ED Triage Notes (Signed)
Pt c/o HA x a few days; Tylenol helps, but HA always comes back; no visual disturbances

## 2022-11-01 NOTE — Discharge Instructions (Signed)
Stay hydrated by drinking plenty of fluids as it will help with the headache.  Take Tylenol as needed for headache.  You may also take magnesium citrate supplement pills as needed if headaches persist.

## 2022-11-01 NOTE — ED Provider Notes (Signed)
[place] [facility] Provider Note   CSN: [id number] Arrival date & time: 11/01/22  1504     History  Chief Complaint  Patient presents with  . Headache    Rebecca Levine is a 42 y.o. female.  Headache improved  The history is provided by the patient and medical records. No language interpreter was used.  Headache    42 year old female significant history of asthma presenting complaining of headache.  Patient report for the past 4 days she has had an ongoing headache.  She describes headache as a throbbing sensation across her forehead that is waxing waning and seems to be improved with Tylenol but would return.  Headache is not associate with any fever or chills no runny nose sneezing or coughing no neck pain no stiffness no focal numbness or focal weakness or rash.  She also denies any nausea vomiting light or sound sensitivity.  She does not normally have headaches.  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  She tries to stay hydrated but unsure if that may contribute to her ongoing headache.  She currently rates her headache as 7 out of 10.  Home Medications Prior to Admission medications   Medication Sig Start Date End Date Taking? Authorizing Provider  cyclobenzaprine (FLEXERIL) 5 MG tablet Take 1 tablet (5 mg total) by mouth at bedtime as needed for muscle spasms. 07/27/17   Caccavale, Sophia, PA-C  ondansetron (ZOFRAN) 4 MG tablet Take 1 tablet (4 mg total) by mouth every 6 (six) hours. 10/21/21   Dartha Lodge, PA-C  ondansetron (ZOFRAN-ODT) 4 MG disintegrating tablet Take 1 tablet (4 mg total) by mouth every 8 (eight) hours as needed. 06/02/22   Marita Kansas, PA-C      Allergies    Aspirin    Review of Systems   Review of Systems  Neurological:  Positive for headaches.  All other systems reviewed and are negative.   Physical Exam Updated Vital Signs BP 126/75   Pulse 76   Temp 98.1  F (36.7 C) (Oral)   Resp 18   LMP 07/20/2017   SpO2 100%  Physical Exam Vitals and nursing note reviewed.  Constitutional:      General: She is not in acute distress.    Appearance: She is well-developed.  HENT:     Head: Normocephalic and atraumatic.     Mouth/Throat:     Mouth: Mucous membranes are moist.  Eyes:     General: No visual field deficit.    Extraocular Movements: Extraocular movements intact.     Conjunctiva/sclera: Conjunctivae normal.     Pupils: Pupils are equal, round, and reactive to light.  Neck:     Meningeal: Brudzinski's sign and Kernig's sign absent.  Cardiovascular:     Rate and Rhythm: Normal rate and regular rhythm.  Pulmonary:     Effort: Pulmonary effort is normal.  Abdominal:     Palpations: Abdomen is soft.  Musculoskeletal:        General: Normal range of motion.     Cervical back: Normal range of motion and neck supple. No rigidity.  Lymphadenopathy:     Cervical: No cervical adenopathy.  Skin:    General: Skin is warm.     Findings: No rash.  Neurological:     Mental Status: She is alert and oriented to person, place, and time.     GCS: GCS eye subscore  is 4. GCS verbal subscore is 5. GCS motor subscore is 6.     Cranial Nerves: No cranial nerve deficit, dysarthria or facial asymmetry.     Sensory: No sensory deficit.     Deep Tendon Reflexes: Reflexes normal.  Psychiatric:        Mood and Affect: Mood normal.        Speech: Speech normal.    ED Results / Procedures / Treatments   Labs (all labs ordered are listed, but only abnormal results are displayed) Labs Reviewed - No data to display  EKG None  Radiology No results found.  Procedures Procedures    Medications Ordered in ED Medications - No data to display  ED Course/ Medical Decision Making/ A&P                             Medical Decision Making Risk Prescription drug management.   BP 126/75   Pulse 76   Temp 98.1 F (36.7 C) (Oral)   Resp 18   LMP  07/20/2017   SpO2 100%   56:30 PM 42 year old female significant history of asthma presenting complaining of headache.  Patient report for the past 4 days she has had an ongoing headache.  She describes headache as a throbbing sensation across her forehead that is waxing waning and seems to be improved with Tylenol but would return.  Headache is not associate with any fever or chills no runny nose sneezing or coughing no neck pain no stiffness no focal numbness or focal weakness or rash.  She also denies any nausea vomiting light or sound sensitivity.  She does not normally have headaches.  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  She tries to stay hydrated but unsure if that may contribute to her ongoing headache.  She currently rates her headache as 7 out of 10.  On exam this is a well-appearing female sitting comfortably in the bed appears to be in no acute discomfort.  Head is normocephalic, atraumatic, no nuchal rigidity, heart and lung sounds normal abdomen is soft nontender.  Patient has 5 out of 5 strength all 4 extremities.  She does not have any focal neurodeficit on exam.  She ambulates without difficulty.  DDx: Tension headache, migraine headache, cluster headache, hemorrhagic stroke, space-occupying lesion, meningitis  Headache without any red flags, patient overall well-appearing, I have considered labs and imaging including head CT scan but I felt it is low yield and patient agrees.  Will provide supportive care which includes Toradol injection and will discharge home with recommendation to continue with Tylenol as needed.  Offered IV fluid patient declined.  I suspect this is likely to be tension headache from the heat.  Vital signs reviewed and overall reassuring.  5:04 PM Headache improved with Toradol.  Patient stable for discharge.  Recommend Tylenol as needed for headache, return precaution given.         Final Clinical  Impression(s) / ED Diagnoses Final diagnoses:  Bad headache    Rx / DC Orders ED Discharge Orders          Ordered    acetaminophen (TYLENOL) 500 MG tablet  Every 6 hours PRN        11/01/22 1704              Fayrene Helper, PA-C 11/01/22 1706    Loetta Rough, MD 11/01/22  1854  

## 2023-07-26 ENCOUNTER — Emergency Department (HOSPITAL_BASED_OUTPATIENT_CLINIC_OR_DEPARTMENT_OTHER)
Admission: EM | Admit: 2023-07-26 | Discharge: 2023-07-26 | Disposition: A | Discharge status: Home / Self Care | Attending: Emergency Medicine | Admitting: Emergency Medicine

## 2023-07-26 ENCOUNTER — Other Ambulatory Visit: Payer: Self-pay

## 2023-07-26 ENCOUNTER — Encounter (HOSPITAL_BASED_OUTPATIENT_CLINIC_OR_DEPARTMENT_OTHER): Payer: Self-pay | Admitting: Emergency Medicine

## 2023-07-26 DIAGNOSIS — J45909 Unspecified asthma, uncomplicated: Secondary | ICD-10-CM | POA: Diagnosis not present

## 2023-07-26 DIAGNOSIS — A084 Viral intestinal infection, unspecified: Secondary | ICD-10-CM | POA: Diagnosis not present

## 2023-07-26 DIAGNOSIS — R197 Diarrhea, unspecified: Secondary | ICD-10-CM | POA: Diagnosis present

## 2023-07-26 LAB — CBC WITH DIFFERENTIAL/PLATELET
Abs Immature Granulocytes: 0.01 10*3/uL (ref 0.00–0.07)
Basophils Absolute: 0 10*3/uL (ref 0.0–0.1)
Basophils Relative: 0 %
Eosinophils Absolute: 0.1 10*3/uL (ref 0.0–0.5)
Eosinophils Relative: 1 %
HCT: 38.8 % (ref 36.0–46.0)
Hemoglobin: 11.9 g/dL — ABNORMAL LOW (ref 12.0–15.0)
Immature Granulocytes: 0 %
Lymphocytes Relative: 9 %
Lymphs Abs: 0.6 10*3/uL — ABNORMAL LOW (ref 0.7–4.0)
MCH: 25.6 pg — ABNORMAL LOW (ref 26.0–34.0)
MCHC: 30.7 g/dL (ref 30.0–36.0)
MCV: 83.4 fL (ref 80.0–100.0)
Monocytes Absolute: 0.3 10*3/uL (ref 0.1–1.0)
Monocytes Relative: 5 %
Neutro Abs: 5.4 10*3/uL (ref 1.7–7.7)
Neutrophils Relative %: 85 %
Platelets: 243 10*3/uL (ref 150–400)
RBC: 4.65 MIL/uL (ref 3.87–5.11)
RDW: 16.2 % — ABNORMAL HIGH (ref 11.5–15.5)
WBC: 6.4 10*3/uL (ref 4.0–10.5)
nRBC: 0 % (ref 0.0–0.2)

## 2023-07-26 LAB — COMPREHENSIVE METABOLIC PANEL WITH GFR
ALT: 17 U/L (ref 0–44)
AST: 23 U/L (ref 15–41)
Albumin: 3.9 g/dL (ref 3.5–5.0)
Alkaline Phosphatase: 93 U/L (ref 38–126)
Anion gap: 8 (ref 5–15)
BUN: 9 mg/dL (ref 6–20)
CO2: 26 mmol/L (ref 22–32)
Calcium: 8.8 mg/dL — ABNORMAL LOW (ref 8.9–10.3)
Chloride: 105 mmol/L (ref 98–111)
Creatinine, Ser: 0.71 mg/dL (ref 0.44–1.00)
GFR, Estimated: >60 mL/min (ref >60)
Glucose, Bld: 95 mg/dL (ref 70–99)
Potassium: 3.7 mmol/L (ref 3.5–5.1)
Sodium: 139 mmol/L (ref 135–145)
Total Bilirubin: 0.7 mg/dL (ref 0.0–1.2)
Total Protein: 7.6 g/dL (ref 6.5–8.1)

## 2023-07-26 LAB — RESP PANEL BY RT-PCR (RSV, FLU A&B, COVID)  RVPGX2
Influenza A by PCR: NEGATIVE
Influenza B by PCR: NEGATIVE
Resp Syncytial Virus by PCR: NEGATIVE
SARS Coronavirus 2 by RT PCR: NEGATIVE

## 2023-07-26 LAB — HCG, QUANTITATIVE, PREGNANCY: hCG, Beta Chain, Quant, S: 1 m[IU]/mL (ref <5)

## 2023-07-26 MED ORDER — KETOROLAC TROMETHAMINE 15 MG/ML IJ SOLN
15.0000 mg | Freq: Once | INTRAMUSCULAR | Status: AC
  Administered 2023-07-26: 15 mg via INTRAVENOUS
  Filled 2023-07-26: qty 1

## 2023-07-26 MED ORDER — SODIUM CHLORIDE 0.9 % IV BOLUS
1000.0000 mL | Freq: Once | INTRAVENOUS | Status: AC
  Administered 2023-07-26: 1000 mL via INTRAVENOUS

## 2023-07-26 MED ORDER — ACETAMINOPHEN 500 MG PO TABS
1000.0000 mg | ORAL_TABLET | Freq: Once | ORAL | Status: AC
  Administered 2023-07-26: 1000 mg via ORAL
  Filled 2023-07-26: qty 2

## 2023-07-26 MED ORDER — ONDANSETRON HCL 4 MG PO TABS: 4.0000 mg | ORAL_TABLET | Freq: Four times a day (QID) | ORAL | 0 refills | Status: DC

## 2023-07-26 MED ORDER — ONDANSETRON HCL 4 MG PO TABS: 4.0000 mg | ORAL_TABLET | Freq: Four times a day (QID) | ORAL | 0 refills | Status: AC

## 2023-07-26 MED ORDER — ONDANSETRON 4 MG PO TBDP
4.0000 mg | ORAL_TABLET | Freq: Once | ORAL | Status: AC
  Administered 2023-07-26: 4 mg via ORAL
  Filled 2023-07-26: qty 1

## 2023-07-26 NOTE — Discharge Instructions (Addendum)
 You can take Zofran every 8 hours for nausea.  Your symptoms should begin to improve over the next few days.  Your blood work was normal.  You likely have a viral stomach bug.  Try to drink water, and eat things that are easy on the stomach like soup over the next few days.

## 2023-07-26 NOTE — ED Provider Notes (Signed)
 [place] [facility] Provider Note  CSN: [id number] Arrival date & time: 07/26/23 1324  Chief Complaint(s) No chief complaint on file.  HPI Rebecca Levine is a 43 y.o. female who is here today for fatigue, vomiting and diarrhea.  Patient works as a Office manager at [employer].  Symptoms began this morning.   Past Medical History Past Medical History:  Diagnosis Date   Asthma    There are no active problems to display for this patient.  Home Medication(s) Prior to Admission medications   Medication Sig Start Date End Date Taking? Authorizing Provider  acetaminophen (TYLENOL) 500 MG tablet Take 2 tablets (1,000 mg total) by mouth every 6 (six) hours as needed for headache. 11/01/22   Fayrene Helper, PA-C  cyclobenzaprine (FLEXERIL) 5 MG tablet Take 1 tablet (5 mg total) by mouth at bedtime as needed for muscle spasms. 07/27/17   Caccavale, Sophia, PA-C  ondansetron (ZOFRAN) 4 MG tablet Take 1 tablet (4 mg total) by mouth every 6 (six) hours. 10/21/21   Rosezella Rumpf, PA-C  ondansetron (ZOFRAN-ODT) 4 MG disintegrating tablet Take 1 tablet (4 mg total) by mouth every 8 (eight) hours as needed. 06/02/22   Marita Kansas, PA-C                                                                                                                                    Past Surgical History Past Surgical History:  Procedure Laterality Date   CESAREAN SECTION     Family History Family History  Problem Relation Age of Onset   Lupus Mother    Rheum arthritis Mother    Multiple sclerosis Sister     Social History Social History   Tobacco Use   Smoking status: Never   Smokeless tobacco: Never  Vaping Use   Vaping status: Never Used  Substance Use Topics   Alcohol use: No   Drug use: No   Allergies Aspirin  Review of Systems Review of Systems  Physical Exam Vital Signs  I have reviewed the triage vital signs BP 126/87   Pulse 96   Temp 99.9 F  (37.7 C)   Resp 20   Ht 5\' 7"  (1.702 m)   Wt 87.5 kg   LMP 07/20/2017   SpO2 100%   BMI 30.23 kg/m   Physical Exam Vitals reviewed.  HENT:     Head: Normocephalic.  Eyes:     Pupils: Pupils are equal, round, and reactive to light.  Cardiovascular:     Rate and Rhythm: Normal rate.  Pulmonary:     Effort: Pulmonary effort is normal.  Abdominal:     General: Abdomen is flat.  Musculoskeletal:     Cervical back: Normal range of motion.  Skin:    General: Skin is warm.  Neurological:     General: No focal deficit present.     Mental Status: She  is alert.     ED Results and Treatments Labs (all labs ordered are listed, but only abnormal results are displayed) Labs Reviewed  RESP PANEL BY RT-PCR (RSV, FLU A&B, COVID)  RVPGX2  COMPREHENSIVE METABOLIC PANEL WITH GFR  CBC WITH DIFFERENTIAL/PLATELET  HCG, QUANTITATIVE, PREGNANCY                                                                                                                          Radiology No results found.  Pertinent labs & imaging results that were available during my care of the patient were reviewed by me and considered in my medical decision making (see MDM for details).  Medications Ordered in ED Medications  sodium chloride 0.9 % bolus 1,000 mL (has no administration in time range)  ondansetron (ZOFRAN-ODT) disintegrating tablet 4 mg (4 mg Oral Given 07/26/23 1958)  acetaminophen (TYLENOL) tablet 1,000 mg (1,000 mg Oral Given 07/26/23 2031)                                                                                                                                     Procedures Procedures  (including critical care time)  Medical Decision Making / ED Course   This patient presents to the ED for concern of nausea vomiting diarrhea, headache and fatigue, this involves an extensive number of treatment options, and is a complaint that carries with it a high risk of complications and morbidity.   The differential diagnosis includes viral syndrome, less likely acute intra-abdominal infection.  MDM: Based on patient's duration of symptoms, physical exam and symptoms, believe this is likely viral syndrome.  Unfortunately, patient likely picked something up in the hospital when she was at work.  We are seeing high portion of viral gastroenteritis symptoms recently.  Will provide the patient with some fluids, check basic labs, give Zofran.  Patient's abdomen soft, do not believe she requires imaging.  Reassessment 9:30 PM-reviewed the patient's labs.  Normal renal function, normal electrolytes.  Pregnancy negative.  Will write her some Toradol, she is feeling better after receiving Tylenol and IV fluids.  Prescription for Zofran sent.  Will discharge.   Additional history obtained:  -External records from outside source obtained and reviewed including: Chart review including previous notes, labs, imaging, consultation notes   Lab Tests: -I ordered, reviewed, and interpreted labs.   The pertinent results include:   Labs Reviewed  RESP PANEL BY RT-PCR (RSV, FLU A&B, COVID)  RVPGX2  COMPREHENSIVE METABOLIC PANEL WITH GFR  CBC WITH DIFFERENTIAL/PLATELET  HCG, QUANTITATIVE, PREGNANCY     Imaging Studies ordered: I ordered imaging studies including  I independently visualized and interpreted imaging. I agree with the radiologist interpretation   Medicines ordered and prescription drug management: Meds ordered this encounter  Medications   ondansetron (ZOFRAN-ODT) disintegrating tablet 4 mg   sodium chloride 0.9 % bolus 1,000 mL   acetaminophen (TYLENOL) tablet 1,000 mg    -I have reviewed the patients home medicines and have made adjustments as needed  Cardiac Monitoring: The patient was maintained on a cardiac monitor.  I personally viewed and interpreted the cardiac monitored which showed an underlying rhythm of: Normal sinus rhythm  Social Determinants of Health:  Factors  impacting patients care include:    Reevaluation: After the interventions noted above, I reevaluated the patient and found that they have :improved  Co morbidities that complicate the patient evaluation  Past Medical History:  Diagnosis Date   Asthma       Dispostion: Discharge     Final Clinical Impression(s) / ED Diagnoses Final diagnoses:  None     @PCDICTATION @    Anders Simmonds T, DO 07/26/23 2136

## 2023-07-26 NOTE — ED Triage Notes (Signed)
 Pt reports she has nausea, diarrhea and HA that started this morning

## 2023-12-09 ENCOUNTER — Encounter (HOSPITAL_BASED_OUTPATIENT_CLINIC_OR_DEPARTMENT_OTHER): Payer: Self-pay | Admitting: Emergency Medicine

## 2023-12-09 ENCOUNTER — Emergency Department (HOSPITAL_BASED_OUTPATIENT_CLINIC_OR_DEPARTMENT_OTHER)
Admission: EM | Admit: 2023-12-09 | Discharge: 2023-12-09 | Disposition: A | Discharge status: Home / Self Care | Payer: Self-pay | Attending: Emergency Medicine | Admitting: Emergency Medicine

## 2023-12-09 ENCOUNTER — Other Ambulatory Visit: Payer: Self-pay

## 2023-12-09 DIAGNOSIS — M545 Low back pain, unspecified: Secondary | ICD-10-CM

## 2023-12-09 LAB — URINALYSIS, ROUTINE W REFLEX MICROSCOPIC
Bilirubin Urine: NEGATIVE
Glucose, UA: NEGATIVE mg/dL
Hgb urine dipstick: NEGATIVE
Ketones, ur: NEGATIVE mg/dL
Leukocytes,Ua: NEGATIVE
Nitrite: NEGATIVE
Protein, ur: NEGATIVE mg/dL
Specific Gravity, Urine: 1.025 (ref 1.005–1.030)
pH: 6.5 (ref 5.0–8.0)

## 2023-12-09 MED ORDER — ACETAMINOPHEN 500 MG PO TABS
1000.0000 mg | ORAL_TABLET | Freq: Once | ORAL | Status: AC
  Administered 2023-12-09 (×2): 1000 mg via ORAL
  Filled 2023-12-09: qty 2

## 2023-12-09 MED ORDER — KETOROLAC TROMETHAMINE 15 MG/ML IJ SOLN
15.0000 mg | Freq: Once | INTRAMUSCULAR | Status: AC
  Administered 2023-12-09 (×2): 15 mg via INTRAMUSCULAR
  Filled 2023-12-09: qty 1

## 2023-12-09 MED ORDER — METHOCARBAMOL 500 MG PO TABS: 500.0000 mg | ORAL_TABLET | Freq: Two times a day (BID) | ORAL | 0 refills | Status: AC

## 2023-12-09 NOTE — ED Triage Notes (Signed)
 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 Now has back pain and possible UTI.

## 2023-12-09 NOTE — ED Provider Notes (Signed)
 [place] [facility] Provider Note   CSN: [id number] Arrival date & time: 12/09/23  0536     Patient presents with: Back Pain   Rebecca Levine is a 43 y.o. female.   43 yo F with a chief complaints of low back discomfort.  This been going on for a couple weeks now.  Nothing seems to make it better or worse just tends to be there.  She has had some increased urinary frequency but denies dysuria or hesitancy.  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  No fevers no abdominal pain.  No radiation of her back pain.   Back Pain      Prior to Admission medications   Medication Sig Start Date End Date Taking? Authorizing Provider  methocarbamol  (ROBAXIN ) 500 MG tablet Take 1 tablet (500 mg total) by mouth 2 (two) times daily. 12/09/23  Yes Emil Share, DO  acetaminophen  (TYLENOL ) 500 MG tablet Take 2 tablets (1,000 mg total) by mouth every 6 (six) hours as needed for headache. 11/01/22   Nivia Colon, PA-C  cyclobenzaprine  (FLEXERIL ) 5 MG tablet Take 1 tablet (5 mg total) by mouth at bedtime as needed for muscle spasms. 07/27/17   Caccavale, Sophia, PA-C  ondansetron  (ZOFRAN ) 4 MG tablet Take 1 tablet (4 mg total) by mouth every 6 (six) hours. 07/26/23   Mannie Pac T, DO  ondansetron  (ZOFRAN -ODT) 4 MG disintegrating tablet Take 1 tablet (4 mg total) by mouth every 8 (eight) hours as needed. 06/02/22   Hildegard Loge, PA-C    Allergies: Aspirin    Review of Systems  Musculoskeletal:  Positive for back pain.    Updated Vital Signs BP (!) 141/100 (BP Location: Right Arm)   Pulse 81   Temp 97.9 F (36.6 C) (Oral)   Resp 18   Ht 5' 7 (1.702 m)   Wt 87.5 kg   LMP 07/20/2017   SpO2 100%   BMI 30.23 kg/m   Physical Exam Vitals and nursing note reviewed.  Constitutional:      General: She is not in acute distress.    Appearance: She is well-developed. She is not diaphoretic.  HENT:     Head: Normocephalic and  atraumatic.  Eyes:     Pupils: Pupils are equal, round, and reactive to light.  Cardiovascular:     Rate and Rhythm: Normal rate and regular rhythm.     Heart sounds: No murmur heard.    No friction rub. No gallop.  Pulmonary:     Effort: Pulmonary effort is normal.     Breath sounds: No wheezing or rales.  Abdominal:     General: There is no distension.     Palpations: Abdomen is soft.     Tenderness: There is no abdominal tenderness.  Musculoskeletal:        General: No tenderness.     Cervical back: Normal range of motion and neck supple.     Comments: No obvious midline spinal tenderness step-offs or deformities.  Reflexes are 2+ and equal bilaterally.  No clonus.  Skin:    General: Skin is warm and dry.  Neurological:     Mental Status: She is alert and oriented to person, place, and time.  Psychiatric:        Behavior: Behavior normal.     (all labs ordered are listed, but only abnormal results are displayed) Labs Reviewed  URINALYSIS, ROUTINE W REFLEX MICROSCOPIC  EKG: None  Radiology: No results found.   Procedures   Medications Ordered in the ED  acetaminophen  (TYLENOL ) tablet 1,000 mg (1,000 mg Oral Given 12/09/23 0554)  ketorolac  (TORADOL ) 15 MG/ML injection 15 mg (15 mg Intramuscular Given 12/09/23 0554)                                    Medical Decision Making Amount and/or Complexity of Data Reviewed Labs: ordered.  Risk OTC drugs. Prescription drug management.   43 yo F with a chief complaint of low back pain.  This is diffuse about the low back going on for a couple weeks.  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  She is worried she has a urinary tract infection.  Will obtain a UA.  Treat symptoms.  UA negative for infection.  Will treat as musculoskeletal.  PCP follow-up.  6:08 AM:  I have discussed the diagnosis/risks/treatment options with the patient.  Evaluation and diagnostic testing in  the emergency department does not suggest an emergent condition requiring admission or immediate intervention beyond what has been performed at this time.  They will follow up with PCP. We also discussed returning to the ED immediately if new or worsening sx occur. We discussed the sx which are most concerning (e.g., sudden worsening pain, fever, inability to tolerate by mouth, cauda equina s/sx) that necessitate immediate return. Medications administered to the patient during their visit and any new prescriptions provided to the patient are listed below.  Medications given during this visit Medications  acetaminophen  (TYLENOL ) tablet 1,000 mg (1,000 mg Oral Given 12/09/23 0554)  ketorolac  (TORADOL ) 15 MG/ML injection 15 mg (15 mg Intramuscular Given 12/09/23 0554)     The patient appears reasonably screen and/or stabilized for discharge and I doubt any other medical condition or other [facility] requiring further screening, evaluation, or treatment in the ED at this time prior to discharge.       Final diagnoses:  Acute bilateral low back pain without sciatica    ED Discharge Orders          Ordered    methocarbamol  (ROBAXIN ) 500 MG tablet  2 times daily        12/09/23 0606               Emil Share, DO 12/09/23 [phone]

## 2023-12-09 NOTE — Discharge Instructions (Signed)
 Your urine showed no signs of infection.  Your back pain is most likely due to a muscular strain.  There is been a lot of research on back pain, unfortunately the only thing that seems to really help is Tylenol  and ibuprofen .  Relative rest is also important to not lift greater than 10 pounds bending or twisting at the waist.  Please follow-up with your family physician.  The other thing that really seems to benefit patients is physical therapy which your doctor may send you for.  Please return to the emergency department for new numbness or weakness to your arms or legs. Difficulty with urinating or urinating or pooping on yourself.  Also if you cannot feel toilet paper when you wipe or get a fever.   Take 4 over the counter ibuprofen  tablets 3 times a day or 2 over-the-counter naproxen tablets twice a day for pain. Also take tylenol  1000mg (2 extra strength) four times a day.   Stretches can also help try [email or web address]

## 2024-02-22 ENCOUNTER — Other Ambulatory Visit: Payer: Self-pay

## 2024-02-22 ENCOUNTER — Encounter (HOSPITAL_BASED_OUTPATIENT_CLINIC_OR_DEPARTMENT_OTHER): Payer: Self-pay | Admitting: Emergency Medicine

## 2024-02-22 ENCOUNTER — Emergency Department (HOSPITAL_BASED_OUTPATIENT_CLINIC_OR_DEPARTMENT_OTHER)
Admission: EM | Admit: 2024-02-22 | Discharge: 2024-02-22 | Disposition: A | Discharge status: Home / Self Care | Attending: Emergency Medicine | Admitting: Emergency Medicine

## 2024-02-22 DIAGNOSIS — J069 Acute upper respiratory infection, unspecified: Secondary | ICD-10-CM | POA: Diagnosis not present

## 2024-02-22 DIAGNOSIS — R059 Cough, unspecified: Secondary | ICD-10-CM | POA: Diagnosis present

## 2024-02-22 LAB — RESP PANEL BY RT-PCR (RSV, FLU A&B, COVID)  RVPGX2
Influenza A by PCR: NEGATIVE
Influenza B by PCR: NEGATIVE
Resp Syncytial Virus by PCR: NEGATIVE
SARS Coronavirus 2 by RT PCR: NEGATIVE

## 2024-02-22 NOTE — ED Triage Notes (Signed)
 Sinus pressure x 4 days , cough , drainage in back of throat , headache , chills

## 2024-02-22 NOTE — ED Provider Notes (Signed)
 [place] [facility] Provider Note   CSN: [id number] Arrival date & time: 02/22/24  1154     Patient presents with: Facial Pain   Rebecca Levine is a 43 y.o. female who presents with concern of bodyaches along with cough and postnasal drip this been worsening over the last 5 days.  She has been taking any over-the-counter medications, does have a previous history of asthma but does not have any nocturnal awakening, is not needing to have increased frequency of her albuterol  treatments.  Denies any chest pain, does have a mild sore throat, decreased appetite, otherwise is tolerating oral intake well, concerned that she may have flu or COVID.  States that cough is worse in the mornings, but is easily cleared after she takes a shower and has breakfast.   HPI     Prior to Admission medications   Medication Sig Start Date End Date Taking? Authorizing Provider  acetaminophen  (TYLENOL ) 500 MG tablet Take 2 tablets (1,000 mg total) by mouth every 6 (six) hours as needed for headache. 11/01/22   Nivia Colon, PA-C  cyclobenzaprine  (FLEXERIL ) 5 MG tablet Take 1 tablet (5 mg total) by mouth at bedtime as needed for muscle spasms. 07/27/17   Caccavale, Sophia, PA-C  methocarbamol  (ROBAXIN ) 500 MG tablet Take 1 tablet (500 mg total) by mouth 2 (two) times daily. 12/09/23   Emil Share, DO  ondansetron  (ZOFRAN ) 4 MG tablet Take 1 tablet (4 mg total) by mouth every 6 (six) hours. 07/26/23   Mannie Fairy DASEN, DO  ondansetron  (ZOFRAN -ODT) 4 MG disintegrating tablet Take 1 tablet (4 mg total) by mouth every 8 (eight) hours as needed. 06/02/22   Hildegard Loge, PA-C    Allergies: Aspirin    Review of Systems  HENT:  Positive for congestion.   Respiratory:  Positive for cough.   Musculoskeletal:  Positive for myalgias.  All other systems reviewed and are negative.   Updated Vital Signs BP (!) 133/90   Pulse 81   Temp (!) 97.5 F (36.4 C)   Resp 20   Wt 90.3 kg    LMP 07/20/2017   SpO2 97%   BMI 31.17 kg/m   Physical Exam Vitals and nursing note reviewed.  Constitutional:      General: She is awake. She is not in acute distress.    Appearance: Normal appearance. She is well-developed, well-groomed and normal weight.  HENT:     Head: Normocephalic and atraumatic.     Right Ear: Hearing, tympanic membrane, ear canal and external ear normal.     Left Ear: Hearing, tympanic membrane, ear canal and external ear normal.     Mouth/Throat:     Lips: Pink.     Mouth: Mucous membranes are moist.     Pharynx: Oropharynx is clear. Uvula midline. Posterior oropharyngeal erythema present. No pharyngeal swelling or oropharyngeal exudate.  Eyes:     Extraocular Movements: Extraocular movements intact.     Conjunctiva/sclera: Conjunctivae normal.     Pupils: Pupils are equal, round, and reactive to light.  Cardiovascular:     Rate and Rhythm: Normal rate and regular rhythm.     Pulses: Normal pulses.     Heart sounds: Normal heart sounds. No murmur heard.    No friction rub. No gallop.  Pulmonary:     Effort: Pulmonary effort is normal.     Breath sounds: Normal breath sounds and air entry.  Abdominal:     General: Abdomen is flat.  Bowel sounds are normal.     Palpations: Abdomen is soft.  Musculoskeletal:        General: Normal range of motion.     Cervical back: Normal range of motion and neck supple.     Right lower leg: No edema.     Left lower leg: No edema.  Lymphadenopathy:     Cervical: No cervical adenopathy.  Skin:    General: Skin is warm and dry.     Capillary Refill: Capillary refill takes less than 2 seconds.  Neurological:     General: No focal deficit present.     Mental Status: She is alert and oriented to person, place, and time. Mental status is at baseline.     GCS: GCS eye subscore is 4. GCS verbal subscore is 5. GCS motor subscore is 6.  Psychiatric:        Mood and Affect: Mood normal.        Behavior: Behavior is  cooperative.     (all labs ordered are listed, but only abnormal results are displayed) Labs Reviewed  RESP PANEL BY RT-PCR (RSV, FLU A&B, COVID)  [id number]    EKG: None  Radiology: No results found.   Procedures   Medications Ordered in the ED - No data to display                                  Medical Decision Making  Based on presenting symptoms and history of present illness, respiratory panel was obtained by nasopharyngeal swab which is negative.  Consider possible flu/COVID/RSV however in the presence of negative test results find that this is not likely at this time.  She is breathing normally with good lung sounds, no adventitious lung sounds appreciated good air entry in all lung fields.  Posterior oropharynx is mildly erythematous however does not show any signs of tonsillar edema or exudate.  Given these findings, this suggests likely etiology of viral URI with cough, discussed symptomatic management with the patient along with careful return precautions and follow-up with primary care as needed.  Patient voices understanding and agreement has no further concerns at this time.     Final diagnoses:  Viral upper respiratory tract infection    ED Discharge Orders     None          Myriam Dorn BROCKS, [place] 02/22/24 1334    Dreama Longs, MD 02/22/24 2200
